# Patient Record
Sex: Male | Born: 1970 | Race: White | Hispanic: No | Marital: Single | Smoking: Never smoker
Health system: Southern US, Community
[De-identification: ages and names within clinical notes are randomized; demographics above are authoritative.]

## PROBLEM LIST (undated history)

## (undated) DIAGNOSIS — K2970 Gastritis, unspecified, without bleeding: Secondary | ICD-10-CM

## (undated) DIAGNOSIS — K449 Diaphragmatic hernia without obstruction or gangrene: Secondary | ICD-10-CM

## (undated) DIAGNOSIS — K2270 Barrett's esophagus without dysplasia: Secondary | ICD-10-CM

## (undated) DIAGNOSIS — B9681 Helicobacter pylori [H. pylori] as the cause of diseases classified elsewhere: Secondary | ICD-10-CM

## (undated) DIAGNOSIS — K219 Gastro-esophageal reflux disease without esophagitis: Secondary | ICD-10-CM

## (undated) DIAGNOSIS — T7840XA Allergy, unspecified, initial encounter: Secondary | ICD-10-CM

## (undated) DIAGNOSIS — E782 Mixed hyperlipidemia: Secondary | ICD-10-CM

## (undated) DIAGNOSIS — J329 Chronic sinusitis, unspecified: Secondary | ICD-10-CM

## (undated) HISTORY — DX: Gastritis, unspecified, without bleeding: K29.70

## (undated) HISTORY — DX: Chronic sinusitis, unspecified: J32.9

## (undated) HISTORY — PX: UPPER GI ENDOSCOPY: SHX6162

## (undated) HISTORY — DX: Gastro-esophageal reflux disease without esophagitis: K21.9

## (undated) HISTORY — DX: Allergy, unspecified, initial encounter: T78.40XA

## (undated) HISTORY — DX: Helicobacter pylori (H. pylori) as the cause of diseases classified elsewhere: B96.81

## (undated) HISTORY — DX: Mixed hyperlipidemia: E78.2

## (undated) HISTORY — PX: SINUS IRRIGATION: SHX2411

## (undated) HISTORY — DX: Barrett's esophagus without dysplasia: K22.70

## (undated) HISTORY — DX: Diaphragmatic hernia without obstruction or gangrene: K44.9

---

## 2015-04-09 DIAGNOSIS — J320 Chronic maxillary sinusitis: Secondary | ICD-10-CM | POA: Insufficient documentation

## 2016-07-17 ENCOUNTER — Encounter: Payer: Self-pay | Admitting: Physician Assistant

## 2016-07-17 ENCOUNTER — Ambulatory Visit (INDEPENDENT_AMBULATORY_CARE_PROVIDER_SITE_OTHER): Payer: Managed Care, Other (non HMO) | Admitting: Physician Assistant

## 2016-07-17 VITALS — BP 145/90 | HR 69 | Ht 70.0 in | Wt 183.0 lb

## 2016-07-17 DIAGNOSIS — K2100 Gastro-esophageal reflux disease with esophagitis, without bleeding: Secondary | ICD-10-CM

## 2016-07-17 DIAGNOSIS — M25511 Pain in right shoulder: Secondary | ICD-10-CM | POA: Insufficient documentation

## 2016-07-17 DIAGNOSIS — R030 Elevated blood-pressure reading, without diagnosis of hypertension: Secondary | ICD-10-CM

## 2016-07-17 DIAGNOSIS — Z7689 Persons encountering health services in other specified circumstances: Secondary | ICD-10-CM

## 2016-07-17 MED ORDER — MELOXICAM 15 MG PO TABS: ORAL_TABLET | ORAL | 1 refills | Status: DC

## 2016-07-17 NOTE — Patient Instructions (Addendum)
I have ordered fasting labs. The lab is a walk-in open M-F 8a-5p (closed 12:30-1:30p). Nothing to eat or drink after midnight or at least 8 hours before your blood draw. You can have water and your medications.   For your shoulder: - Meloxicam 1 tab daily with food for 2 weeks, then as needed for pain - Do not take any other OTC pain relievers - Tylenol is okay - Rehab exercises daily (see handout) - Follow-up with Sports Medicine in 2 weeks  For your blood pressure: - Check blood pressure at home for the next 2 weeks - Check around the same time each day in a relaxed setting (rest for at least 3 minutes, legs uncrossed, arm above level or your heart) - Limit salt. Follow DASH eating plan - Bring readings to your Sports Medicine appt. Or make a separate nurse visit for a BP check   DASH Eating Plan DASH stands for "Dietary Approaches to Stop Hypertension." The DASH eating plan is a healthy eating plan that has been shown to reduce high blood pressure (hypertension). It may also reduce your risk for type 2 diabetes, heart disease, and stroke. The DASH eating plan may also help with weight loss. What are tips for following this plan? General guidelines   Avoid eating more than 2,300 mg (milligrams) of salt (sodium) a day. If you have hypertension, you may need to reduce your sodium intake to 1,500 mg a day.  Limit alcohol intake to no more than 1 drink a day for nonpregnant women and 2 drinks a day for men. One drink equals 12 oz of beer, 5 oz of wine, or 1 oz of hard liquor.  Work with your health care provider to maintain a healthy body weight or to lose weight. Ask what an ideal weight is for you.  Get at least 30 minutes of exercise that causes your heart to beat faster (aerobic exercise) most days of the week. Activities may include walking, swimming, or biking.  Work with your health care provider or diet and nutrition specialist (dietitian) to adjust your eating plan to your  individual calorie needs. Reading food labels   Check food labels for the amount of sodium per serving. Choose foods with less than 5 percent of the Daily Value of sodium. Generally, foods with less than 300 mg of sodium per serving fit into this eating plan.  To find whole grains, look for the word "whole" as the first word in the ingredient list. Shopping   Buy products labeled as "low-sodium" or "no salt added."  Buy fresh foods. Avoid canned foods and premade or frozen meals. Cooking   Avoid adding salt when cooking. Use salt-free seasonings or herbs instead of table salt or sea salt. Check with your health care provider or pharmacist before using salt substitutes.  Do not fry foods. Cook foods using healthy methods such as baking, boiling, grilling, and broiling instead.  Cook with heart-healthy oils, such as olive, canola, soybean, or sunflower oil. Meal planning    Eat a balanced diet that includes:  5 or more servings of fruits and vegetables each day. At each meal, try to fill half of your plate with fruits and vegetables.  Up to 6-8 servings of whole grains each day.  Less than 6 oz of lean meat, poultry, or fish each day. A 3-oz serving of meat is about the same size as a deck of cards. One egg equals 1 oz.  2 servings of low-fat dairy each  day.  A serving of nuts, seeds, or beans 5 times each week.  Heart-healthy fats. Healthy fats called Omega-3 fatty acids are found in foods such as flaxseeds and coldwater fish, like sardines, salmon, and mackerel.  Limit how much you eat of the following:  Canned or prepackaged foods.  Food that is high in trans fat, such as fried foods.  Food that is high in saturated fat, such as fatty meat.  Sweets, desserts, sugary drinks, and other foods with added sugar.  Full-fat dairy products.  Do not salt foods before eating.  Try to eat at least 2 vegetarian meals each week.  Eat more home-cooked food and less restaurant,  buffet, and fast food.  When eating at a restaurant, ask that your food be prepared with less salt or no salt, if possible. What foods are recommended? The items listed may not be a complete list. Talk with your dietitian about what dietary choices are best for you. Grains  Whole-grain or whole-wheat bread. Whole-grain or whole-wheat pasta. Brown rice. [place]. Bulgur. Whole-grain and low-sodium cereals. Pita bread. Low-fat, low-sodium crackers. Whole-wheat flour tortillas. Vegetables  Fresh or frozen vegetables (raw, steamed, roasted, or grilled). Low-sodium or reduced-sodium tomato and vegetable juice. Low-sodium or reduced-sodium tomato sauce and tomato paste. Low-sodium or reduced-sodium canned vegetables. Fruits  All fresh, dried, or frozen fruit. Canned fruit in natural juice (without added sugar). Meat and other protein foods  Skinless chicken or [place]. Ground chicken or [place]. Pork with fat trimmed off. Fish and seafood. Egg whites. Dried beans, peas, or lentils. Unsalted nuts, nut butters, and seeds. Unsalted canned beans. Lean cuts of beef with fat trimmed off. Low-sodium, lean deli meat. Dairy  Low-fat (1%) or fat-free (skim) milk. Fat-free, low-fat, or reduced-fat cheeses. Nonfat, low-sodium ricotta or cottage cheese. Low-fat or nonfat yogurt. Low-fat, low-sodium cheese. Fats and oils  Soft margarine without trans fats. Vegetable oil. Low-fat, reduced-fat, or light mayonnaise and salad dressings (reduced-sodium). Canola, safflower, olive, soybean, and sunflower oils. Avocado. Seasoning and other foods  Herbs. Spices. Seasoning mixes without salt. Unsalted popcorn and pretzels. Fat-free sweets. What foods are not recommended? The items listed may not be a complete list. Talk with your dietitian about what dietary choices are best for you. Grains  Baked goods made with fat, such as croissants, muffins, or some breads. Dry pasta or rice meal packs. Vegetables  Creamed or  fried vegetables. Vegetables in a cheese sauce. Regular canned vegetables (not low-sodium or reduced-sodium). Regular canned tomato sauce and paste (not low-sodium or reduced-sodium). Regular tomato and vegetable juice (not low-sodium or reduced-sodium). Angie Fava. Olives. Fruits  Canned fruit in a light or heavy syrup. Fried fruit. Fruit in cream or butter sauce. Meat and other protein foods  Fatty cuts of meat. Ribs. Fried meat. Berniece Salines. Sausage. Bologna and other processed lunch meats. Salami. Fatback. Hotdogs. Bratwurst. Salted nuts and seeds. Canned beans with added salt. Canned or smoked fish. Whole eggs or egg yolks. Chicken or [place] with skin. Dairy  Whole or 2% milk, cream, and half-and-half. Whole or full-fat cream cheese. Whole-fat or sweetened yogurt. Full-fat cheese. Nondairy creamers. Whipped toppings. Processed cheese and cheese spreads. Fats and oils  Butter. Stick margarine. Lard. Shortening. Ghee. Bacon fat. Tropical oils, such as coconut, palm kernel, or palm oil. Seasoning and other foods  Salted popcorn and pretzels. Onion salt, garlic salt, seasoned salt, table salt, and sea salt. Worcestershire sauce. Tartar sauce. Barbecue sauce. Teriyaki sauce. Soy sauce, including reduced-sodium. Steak sauce. Canned and  packaged gravies. Fish sauce. Oyster sauce. Cocktail sauce. Horseradish that you find on the shelf. Ketchup. Mustard. Meat flavorings and tenderizers. Bouillon cubes. Hot sauce and Tabasco sauce. Premade or packaged marinades. Premade or packaged taco seasonings. Relishes. Regular salad dressings. Where to find more information:  National Heart, Lung, and [place]: https://wilson-eaton.com/  American Heart Association: www.heart.org Summary  The DASH eating plan is a healthy eating plan that has been shown to reduce high blood pressure (hypertension). It may also reduce your risk for type 2 diabetes, heart disease, and stroke.  With the DASH eating plan, you should limit salt  (sodium) intake to 2,300 mg a day. If you have hypertension, you may need to reduce your sodium intake to 1,500 mg a day.  When on the DASH eating plan, aim to eat more fresh fruits and vegetables, whole grains, lean proteins, low-fat dairy, and heart-healthy fats.  Work with your health care provider or diet and nutrition specialist (dietitian) to adjust your eating plan to your individual calorie needs. This information is not intended to replace advice given to you by your health care provider. Make sure you discuss any questions you have with your health care provider. Document Released: 02/27/2011 Document Revised: 03/03/2016 Document Reviewed: 03/03/2016 Elsevier Interactive Patient Education  2017 [employer].

## 2016-07-17 NOTE — Progress Notes (Signed)
HPI:                                                                [name] [name] is a 46 y.o. male who presents to [place]: [facility] today to establish care  Current Concerns include shoulder pain  Right shoulder pain: patient reports pain with overhead lifting that has been present for a few months. He does lift weights at the gym, but denies known injury or trauma. Denies any weakness or paresthesias.   GERD/Barrett's esophagus: was followed by Dr. Dolphus Jenny at [facility]. He also has a history of gastritis and H. Pylori. Taking Omeprazole 40mg  daily. Symptoms are controlled. He is overdue for a repeat EGD. HE is requesting to be tested for H. Pylori today.   Health Maintenance Health Maintenance  Topic Date Due  . HIV Screening  01/17/1988  . TETANUS/TDAP  01/17/1992  . INFLUENZA VACCINE  10/22/2016    Health Habits  Diet: good   Exercise: lifting x 60 mins x 3 days   Past Medical History:  Diagnosis Date  . Allergy   . Barrett's esophagus determined by biopsy   . Chronic sinusitis   . GERD (gastroesophageal reflux disease)   . Helicobacter pylori gastritis   . Hiatal hernia    Past Surgical History:  Procedure Laterality Date  . SINUS IRRIGATION    . UPPER GI ENDOSCOPY     Social History  Substance Use Topics  . Smoking status: Never Smoker  . Smokeless tobacco: Never Used  . Alcohol use 8.4 oz/week    14 Cans of beer per week   family history includes Hypertension in his father; Stomach cancer in his brother; Throat cancer in his father.  ROS: negative except as noted in the HPI  Medications: Current Outpatient Prescriptions  Medication Sig Dispense Refill  . omeprazole (PRILOSEC) 20 MG capsule Take by mouth.    . meloxicam (MOBIC) 15 MG tablet Take 1 tab by mouth daily with meals, then as needed for pain 30 tablet 1   No current facility-administered medications for this visit.    No Known Allergies     Objective:   BP (!) 145/90   Pulse 69   Ht 5\' 10"  (1.778 m)   Wt 183 lb (83 kg)   BMI 26.26 kg/m  Gen: well-groomed, cooperative, not ill-appearing, no distress HEENT: normal conjunctiva, wearing glasses, canals occluded by cerumen bilaterally, oropharynx clear, moist mucus membranes, no thyromegaly or tenderness Pulm: Normal work of breathing, normal phonation, clear to auscultation bilaterally CV: Normal rate, regular rhythm, s1 and s2 distinct, no murmurs, clicks or rubs, no carotid bruit GI: abdomen soft, nondistended, nontender, no masses Neuro: alert and oriented x 3, EOM's intact, PERRLA, normal tone, no tremor MSK: Right Shoulder: atraumatic, no a/c joint tenderness, positive Hawkins sign, positive Neers sign, strength 5/5; moving all extremities, normal gait and station, no peripheral edema Skin: warm and dry, no rashes or lesions on exposed skin Psych: normal affect, euthymic mood, normal speech and thought content  Depression screen [facility] 2/9 07/17/2016  Decreased Interest 0  Down, Depressed, Hopeless 0  PHQ - 2 Score 0    Assessment and Plan: 46 y.o. male with   1. Elevated blood pressure reading -  instructed on how to check BP's at home and provided with log - DASH eating plan - follow-up in 2 weeks  2. Encounter to establish care - CBC - Comprehensive metabolic panel - Lipid Panel w/reflex Direct LDL - declined STI testing  3. Acute pain of right shoulder - suspect rotator cuff pathology, impingement signs on exam, strength intact - history of gastritis, but no history of GI bleed. Patient is on Omeprazole daily and completed treatment for H. Pylori in 10/2015 - meloxicam (MOBIC) 15 MG tablet; Take 1 tab by mouth daily with meals, then as needed for pain  Dispense: 30 tablet; Refill: 1 - instructed to take with food and not to combine with any other NSAIDs - provided with rehab exercises - follow-up with Sports Medicine in 2 weeks   4. GERD with esophagitis, History of H.  Pylori  - reviewed Care Everywhere records, including EGD and biopsy results from 11/01/2015 - H. pylori breath test pending - cont Omeprazole 40mg  daily - recommended he follow-up with Dr. Dolphus Jenny for repeat EGD asap  Patient education and anticipatory guidance given Patient agrees with treatment plan Follow-up in 2 weeks or sooner as needed  Darlyne Russian PA-C

## 2016-07-18 LAB — H. PYLORI BREATH TEST: H. PYLORI BREATH TEST: NOT DETECTED

## 2017-08-27 ENCOUNTER — Ambulatory Visit: Payer: Managed Care, Other (non HMO) | Admitting: Physician Assistant

## 2017-09-28 ENCOUNTER — Ambulatory Visit: Payer: Self-pay | Admitting: Physician Assistant

## 2017-10-08 ENCOUNTER — Ambulatory Visit (INDEPENDENT_AMBULATORY_CARE_PROVIDER_SITE_OTHER): Payer: 59 | Admitting: Physician Assistant

## 2017-10-08 ENCOUNTER — Encounter: Payer: Self-pay | Admitting: Gastroenterology

## 2017-10-08 ENCOUNTER — Encounter: Payer: Self-pay | Admitting: Physician Assistant

## 2017-10-08 VITALS — BP 123/80 | HR 62 | Temp 97.9°F | Wt 185.0 lb

## 2017-10-08 DIAGNOSIS — Z13220 Encounter for screening for lipoid disorders: Secondary | ICD-10-CM

## 2017-10-08 DIAGNOSIS — Z8619 Personal history of other infectious and parasitic diseases: Secondary | ICD-10-CM | POA: Insufficient documentation

## 2017-10-08 DIAGNOSIS — Z131 Encounter for screening for diabetes mellitus: Secondary | ICD-10-CM

## 2017-10-08 DIAGNOSIS — K2270 Barrett's esophagus without dysplasia: Secondary | ICD-10-CM | POA: Diagnosis not present

## 2017-10-08 DIAGNOSIS — J0111 Acute recurrent frontal sinusitis: Secondary | ICD-10-CM

## 2017-10-08 DIAGNOSIS — H6123 Impacted cerumen, bilateral: Secondary | ICD-10-CM | POA: Diagnosis not present

## 2017-10-08 DIAGNOSIS — J310 Chronic rhinitis: Secondary | ICD-10-CM | POA: Diagnosis not present

## 2017-10-08 DIAGNOSIS — Z130 Encounter for screening for diseases of the blood and blood-forming organs and certain disorders involving the immune mechanism: Secondary | ICD-10-CM

## 2017-10-08 MED ORDER — FLUTICASONE PROPIONATE 50 MCG/ACT NA SUSP: 1.0000 | Freq: Every day | NASAL | 3 refills | Status: DC

## 2017-10-08 MED ORDER — FEXOFENADINE HCL 180 MG PO TABS: 180.0000 mg | ORAL_TABLET | Freq: Every day | ORAL | 3 refills | Status: DC

## 2017-10-08 MED ORDER — OMEPRAZOLE 20 MG PO CPDR
20.0000 mg | DELAYED_RELEASE_CAPSULE | Freq: Every day | ORAL | 0 refills | Status: DC
Start: 2017-10-08 — End: 2017-12-29

## 2017-10-08 MED ORDER — DOXYCYCLINE HYCLATE 100 MG PO TABS: 100.0000 mg | ORAL_TABLET | Freq: Two times a day (BID) | ORAL | 0 refills | Status: AC

## 2017-10-08 NOTE — Patient Instructions (Signed)
Sinus Headache A sinus headache occurs when the paranasal sinuses become clogged or swollen. Paranasal sinuses are air pockets within the bones of the face. Sinus headaches can range from mild to severe. What are the causes? A sinus headache can result from various conditions that affect the sinuses, such as:  Colds.  Sinus infections.  Allergies.  What are the signs or symptoms? The main symptom of this condition is a headache that may feel like pain or pressure in the face, forehead, ears, or upper teeth. People who have a sinus headache often have other symptoms, such as:  Congested or runny nose.  Fever.  Inability to smell.  Weather changes can make symptoms worse. How is this diagnosed? This condition may be diagnosed based on:  A physical exam and medical history.  Imaging tests, such as a CT scan and MRI, to check for problems with the sinuses.  A specialist may look into the sinuses with a tool that has a camera (endoscopy).  How is this treated? Treatment for this condition depends on the cause.  Sinus pain that is caused by a sinus infection may be treated with antibiotic medicine.  Sinus pain that is caused by allergies may be helped by allergy medicines (antihistamines) and medicated nasal sprays.  Sinus pain that is caused by congestion may be helped by flushing the nose and sinuses with saline solution.  Follow these instructions at home:  Take medicines only as directed by your health care provider.  If you were prescribed an antibiotic medicine, finish all of it even if you start to feel better.  If you have congestion, use a nasal spray to help reduce pressure.  If directed, apply a warm, moist washcloth to your face to help relieve pain. Contact a health care provider if:  You have headaches more than one time each week.  You have sensitivity to light or sound.  You have a fever.  You feel sick to your stomach (nauseous) or you throw up  (vomit).  Your headaches do not get better with treatment. Many people think that they have a sinus headache when they actually have migraines or tension headaches. Get help right away if:  You have vision problems.  You have sudden, severe pain in your face or head.  You have a seizure.  You are confused.  You have a stiff neck. This information is not intended to replace advice given to you by your health care provider. Make sure you discuss any questions you have with your health care provider. Document Released: 04/17/2004 Document Revised: 11/04/2015 Document Reviewed: 03/06/2014 Elsevier Interactive Patient Education  2018 Elsevier Inc.  

## 2017-10-08 NOTE — Progress Notes (Signed)
HPI:                                                                [name] [name] is a 47 y.o. male who presents to [place]: [facility] Sports Medicine today for headache and nasal congestion  Pleasant 47 yo M with PMH of chronic pansinusitis s/p sinus surgery presents with 10 days of frontal headaches, sinus pressure and jaw pain. Associated with tactile fevers (no measured temp), nasal congestion, purulent nasal drainage and nausea. Also reports intermittent sharp right ear pain. Has been self-treating with Amoxicillin (left-over Rx) x 2 days.  Also he has a history of Barrett's esophagus and H. Pylori. He is overdue for his surveillance EGD. Requesting referral to new GI specialist.   Depression screen [facility] 2/9 07/17/2016  Decreased Interest 0  Down, Depressed, Hopeless 0  PHQ - 2 Score 0    No flowsheet data found.    Past Medical History:  Diagnosis Date  . Allergy   . Barrett's esophagus determined by biopsy   . Chronic sinusitis   . GERD (gastroesophageal reflux disease)   . Helicobacter pylori gastritis   . Hiatal hernia    Past Surgical History:  Procedure Laterality Date  . SINUS IRRIGATION    . UPPER GI ENDOSCOPY     Social History   Tobacco Use  . Smoking status: Never Smoker  . Smokeless tobacco: Never Used  Substance Use Topics  . Alcohol use: Yes    Alcohol/week: 8.4 oz    Types: 14 Cans of beer per week   family history includes Hypertension in his father; Stomach cancer in his brother; Throat cancer in his father.    ROS: negative except as noted in the HPI  Medications: Current Outpatient Medications  Medication Sig Dispense Refill  . omeprazole (PRILOSEC) 20 MG capsule Take by mouth.    . doxycycline (VIBRA-TABS) 100 MG tablet Take 1 tablet (100 mg total) by mouth 2 (two) times daily for 10 days. 20 tablet 0  . fexofenadine (ALLEGRA) 180 MG tablet Take 1 tablet (180 mg total) by mouth at bedtime. 90 tablet 3  .  fluticasone (FLONASE) 50 MCG/ACT nasal spray Place 1 spray into both nostrils daily. 16 g 3   No current facility-administered medications for this visit.    No Known Allergies     Objective:  BP 123/80   Pulse 62   Temp 97.9 F (36.6 C) (Oral)   Wt 185 lb (83.9 kg)   BMI 26.54 kg/m  Gen:  alert, not ill-appearing, no distress, appropriate for age 64: head normocephalic without obvious abnormality, conjunctiva and cornea clear, TM's obstructed by cerumen bilaterally, manual debridement performed with lighted ear currette, unable to visual TM, there is right frontal sinus tenderness, nasal mucosa is edematous with purulent rhinorrhea, oropharynx clear, grade 2+ tonsils, neck supple, no cervical adenopathy, trachea midline Pulm: Normal work of breathing, normal phonation Neuro: alert and oriented x 3, no tremor MSK: extremities atraumatic, normal gait and station Skin: intact, no rashes on exposed skin, no jaundice, no cyanosis  No results found for this or any previous visit (from the past 72 hour(s)). No results found.    Assessment and Plan: 47 y.o. male with   Acute recurrent frontal  sinusitis - Plan: doxycycline (VIBRA-TABS) 100 MG tablet  History of Helicobacter pylori infection - Plan: Ambulatory referral to Gastroenterology  Barrett's esophagus without dysplasia - Plan: Ambulatory referral to Gastroenterology  Chronic rhinitis - Plan: fexofenadine (ALLEGRA) 180 MG tablet, fluticasone (FLONASE) 50 MCG/ACT nasal spray  Bilateral impacted cerumen  - counseled on nasal toileting, daily antihistamine and intranasal steroid - he has a history of chronic sinusitis and has  already been self-treating with Amoxicllin, so will treat empirically with Doxycycline in case of resistance - return for bilateral ear irrigation as needed. Counseled on self-cleaning nature of ears and to avoid Q-tips and fb in the ear  - referral placed to GI for surveillance of Barett's/GERD.  Continue Omeprazole  Patient education and anticipatory guidance given Patient agrees with treatment plan Follow-up as needed if symptoms worsen or fail to improve  Darlyne Russian PA-C

## 2017-10-12 ENCOUNTER — Ambulatory Visit: Payer: Self-pay | Admitting: Physician Assistant

## 2017-11-02 ENCOUNTER — Ambulatory Visit (INDEPENDENT_AMBULATORY_CARE_PROVIDER_SITE_OTHER): Payer: 59 | Admitting: Family Medicine

## 2017-11-02 ENCOUNTER — Encounter: Payer: Self-pay | Admitting: Family Medicine

## 2017-11-02 VITALS — BP 132/84 | HR 70 | Ht 70.0 in | Wt 183.0 lb

## 2017-11-02 DIAGNOSIS — R002 Palpitations: Secondary | ICD-10-CM

## 2017-11-02 DIAGNOSIS — J321 Chronic frontal sinusitis: Secondary | ICD-10-CM

## 2017-11-02 MED ORDER — AMOXICILLIN-POT CLAVULANATE 875-125 MG PO TABS: 1.0000 | ORAL_TABLET | Freq: Two times a day (BID) | ORAL | 0 refills | Status: DC

## 2017-11-02 NOTE — Patient Instructions (Signed)
If you cut back on her caffeine significantly and you are still noticing the heart pounding please let me know. If after 2 weeks on the antibiotic you are significantly better but just not completely better than please call back and we will call in an additional 7 days of antibiotics with Augmentin.

## 2017-11-02 NOTE — Progress Notes (Signed)
Subjective:    Patient ID: [name] [name], male    DOB: [birth year], 47 y.o.   MRN: [id number]  HPI 47 year old male comes in today for persistent sinus symptoms.  He was seen almost 4 weeks ago on July 18 having 10 days of frontal headaches, sinus pressure and jaw pain.  History of sinus surgery.  Was treated with 10 days of doxycycline. He still has facial pain and pressure.  He says the doxycycline did help with the nausea that he was experiencing but did not really help with his sinus pressure pain.  Says he feels like his heart is Flopping or skipping a beat x 1.5 weeks.  His heart which is been a long normal and all of a sudden it will pound just first thing will be and then go back into rhythm.  He does not expense any chest pain with it.  He is taking her reflux med consistantly.  No pain, numbness or tingling in his arm.  Occ gets tingling in his face sometime. Drinks 2-3 energy drinks per day.  Drink has 300 mg of caffeine.   Review of Systems  BP 140/73   Pulse 70   Ht 5\' 10"  (1.778 m)   Wt 183 lb (83 kg)   SpO2 98%   BMI 26.26 kg/m     No Known Allergies  Past Medical History:  Diagnosis Date  . Allergy   . Barrett's esophagus determined by biopsy   . Chronic sinusitis   . GERD (gastroesophageal reflux disease)   . Helicobacter pylori gastritis   . Hiatal hernia     Past Surgical History:  Procedure Laterality Date  . SINUS IRRIGATION    . UPPER GI ENDOSCOPY      Social History   Socioeconomic History  . Marital status: Married    Spouse name: Not on file  . Number of children: Not on file  . Years of education: Not on file  . Highest education level: Not on file  Occupational History  . Not on file  Social Needs  . Financial resource strain: Not on file  . Food insecurity:    Worry: Not on file    Inability: Not on file  . Transportation needs:    Medical: Not on file    Non-medical: Not on file  Tobacco Use  . Smoking status: Never Smoker  .  Smokeless tobacco: Never Used  Substance and Sexual Activity  . Alcohol use: Yes    Alcohol/week: 14.0 standard drinks    Types: 14 Cans of beer per week  . Drug use: No  . Sexual activity: Yes    Birth control/protection: None  Lifestyle  . Physical activity:    Days per week: Not on file    Minutes per session: Not on file  . Stress: Not on file  Relationships  . Social connections:    Talks on phone: Not on file    Gets together: Not on file    Attends religious service: Not on file    Active member of club or organization: Not on file    Attends meetings of clubs or organizations: Not on file    Relationship status: Not on file  . Intimate partner violence:    Fear of current or ex partner: Not on file    Emotionally abused: Not on file    Physically abused: Not on file    Forced sexual activity: Not on file  Other Topics Concern  . Not  on file  Social History Narrative  . Not on file    Family History  Problem Relation Age of Onset  . Hypertension Father   . Throat cancer Father   . Stomach cancer Brother     Outpatient Encounter Medications as of 11/02/2017  Medication Sig  . amoxicillin-clavulanate (AUGMENTIN) 875-125 MG tablet Take 1 tablet by mouth 2 (two) times daily.  . fexofenadine (ALLEGRA) 180 MG tablet Take 1 tablet (180 mg total) by mouth at bedtime.  . fluticasone (FLONASE) 50 MCG/ACT nasal spray Place 1 spray into both nostrils daily.  Marland Kitchen omeprazole (PRILOSEC) 20 MG capsule Take 1 capsule (20 mg total) by mouth daily.   No facility-administered encounter medications on file as of 11/02/2017.          Objective:   Physical Exam  Constitutional: He is oriented to person, place, and time. He appears well-developed and well-nourished.  HENT:  Head: Normocephalic and atraumatic.  Right Ear: External ear normal.  Left Ear: External ear normal.  Nose: Nose normal.  Mouth/Throat: Oropharynx is clear and moist.  TMs and canals are clear.   Eyes:  Pupils are equal, round, and reactive to light. Conjunctivae and EOM are normal.  Neck: Neck supple. No thyromegaly present.  Cardiovascular: Normal rate and normal heart sounds.  Pulmonary/Chest: Effort normal and breath sounds normal.  Lymphadenopathy:    He has no cervical adenopathy.  Neurological: He is alert and oriented to person, place, and time.  Skin: Skin is warm and dry.  + tattoos  Psychiatric: He has a normal mood and affect.          Assessment & Plan:  Sinusitis-we will start with 2 weeks of Augmentin.  At that point if he is feeling better but does not completely better than please call back and we will add an additional 7 days.  Palpitations-did do an EKG today.  It sounds like it is probably related to excess intake of caffeine.  Encouraged him to cut back significantly.  And if symptoms persist then please let me know.  EKG shows rate of 62 bpm, normal sinus rhythm with no acute ST-T wave changes.  Declined Tdap today.

## 2017-11-06 ENCOUNTER — Encounter: Payer: Self-pay | Admitting: Gastroenterology

## 2017-11-06 ENCOUNTER — Ambulatory Visit (INDEPENDENT_AMBULATORY_CARE_PROVIDER_SITE_OTHER): Payer: 59 | Admitting: Gastroenterology

## 2017-11-06 VITALS — BP 104/76 | HR 61 | Ht 69.0 in | Wt 185.4 lb

## 2017-11-06 DIAGNOSIS — K449 Diaphragmatic hernia without obstruction or gangrene: Secondary | ICD-10-CM | POA: Diagnosis not present

## 2017-11-06 DIAGNOSIS — K219 Gastro-esophageal reflux disease without esophagitis: Secondary | ICD-10-CM

## 2017-11-06 DIAGNOSIS — K2270 Barrett's esophagus without dysplasia: Secondary | ICD-10-CM

## 2017-11-06 DIAGNOSIS — R1013 Epigastric pain: Secondary | ICD-10-CM

## 2017-11-06 DIAGNOSIS — G8929 Other chronic pain: Secondary | ICD-10-CM

## 2017-11-06 NOTE — Patient Instructions (Signed)
If you are age 45 or older, your body mass index should be between 23-30. Your Body mass index is 27.38 kg/m. If this is out of the aforementioned range listed, please consider follow up with your Primary Care Provider.  If you are age 67 or younger, your body mass index should be between 19-25. Your Body mass index is 27.38 kg/m. If this is out of the aformentioned range listed, please consider follow up with your Primary Care Provider.   You have been scheduled for an endoscopy. Please follow written instructions given to you at your visit today. If you use inhalers (even only as needed), please bring them with you on the day of your procedure. Your physician has requested that you go to www.startemmi.com and enter the access code given to you at your visit today. This web site gives a general overview about your procedure. However, you should still follow specific instructions given to you by our office regarding your preparation for the procedure.  It was a pleasure to see you today!  Vito Cirigliano, D.O.

## 2017-11-06 NOTE — Progress Notes (Signed)
Chief Complaint: GERD, abdominal pain   Referring Provider:     Trixie Dredge, PA-C    HPI:     [name] [name] is a 47 y.o. male referred to the [facility] for evaluation of abdominal pain and nausea, along with ongoing care for GERD and Barretts Esophagus. He was previously followed by GI in [place] in July 2017, with EGD at that time n/f H pylori gastritis and Barrett's Esophagus. Only path report available for review, so unclear of extent of BE. Additionally, he reports having [place] on that study, but again, unclear of size of HH. Was recommended to have repeat study, but did not f/u.   He has a long hx of GERD with index sxs of HB, regurgitation, and dyspepsia. Sxs overall controlled with omeprazole 20 mg daily, pre-breakfast. Occasaional breakthrough sxs. Exacrbated by spicy, red sauces, and bananas. No associated dysphagia, odynophagia.   Additionally, he endorses epigastric pain and bloating. Sxs similar to 2017 when seeing GI and improved after tx for H pylori, with recurrence in the last 3-4 months. No associated n/v, weight loss, melena, hematochezia, fever, chills, night sweats.    Past Medical History:  Diagnosis Date  . Allergy   . Barrett's esophagus determined by biopsy   . Chronic sinusitis   . GERD (gastroesophageal reflux disease)   . Helicobacter pylori gastritis   . Hiatal hernia      Past Surgical History:  Procedure Laterality Date  . SINUS IRRIGATION    . UPPER GI ENDOSCOPY     Family History  Problem Relation Age of Onset  . Hypertension Father   . Throat cancer Father   . Stomach cancer Brother   . Colon cancer Paternal Aunt   . Rectal cancer Neg Hx    Social History   Tobacco Use  . Smoking status: Never Smoker  . Smokeless tobacco: Never Used  Substance Use Topics  . Alcohol use: Yes    Alcohol/week: 14.0 standard drinks    Types: 14 Cans of beer per week  . Drug use: No   Current Outpatient  Medications  Medication Sig Dispense Refill  . fexofenadine (ALLEGRA) 180 MG tablet Take 1 tablet (180 mg total) by mouth at bedtime. 90 tablet 3  . fluticasone (FLONASE) 50 MCG/ACT nasal spray Place 1 spray into both nostrils daily. 16 g 3  . omeprazole (PRILOSEC) 20 MG capsule Take 1 capsule (20 mg total) by mouth daily. 90 capsule 0  . amoxicillin-clavulanate (AUGMENTIN) 875-125 MG tablet Take 1 tablet by mouth 2 (two) times daily. (Patient not taking: Reported on 11/06/2017) 20 tablet 0   No current facility-administered medications for this visit.    No Known Allergies   Review of Systems: All systems reviewed and negative except where noted in HPI.     Physical Exam:    Wt Readings from Last 3 Encounters:  11/06/17 185 lb 6 oz (84.1 kg)  11/02/17 183 lb (83 kg)  10/08/17 185 lb (83.9 kg)    BP 104/76   Pulse 61   Ht 5\' 9"  (1.753 m)   Wt 185 lb 6 oz (84.1 kg)   BMI 27.38 kg/m  Constitutional:  Pleasant, in no acute distress. Psychiatric: Normal mood and affect. Behavior is normal. EENT: Pupils normal.  Conjunctivae are normal. No scleral icterus. Neck supple. No cervical LAD. Cardiovascular: Normal rate, regular rhythm. No edema Pulmonary/chest: Effort normal and breath sounds normal. No  wheezing, rales or rhonchi. Abdominal: Soft, nondistended, nontender. Bowel sounds active throughout. There are no masses palpable. No hepatomegaly. Neurological: Alert and oriented to person place and time. Skin: Skin is warm and dry. No rashes noted.   ASSESSMENT AND PLAN;   1) GERD: Long standing hx of reflux symptoms (heartburn and regurgitation) which is reported to be relatively well controlled on low dose PPI. However, c/b Barrett's Esophagus of unknown length in 2017. Given active sxs as below, along with BE, will plan on EGD at this time and can also eval for erosive esophagitis suggestive of silent reflux/burnout at that time  - Resume Prilosec 20 mg daily for now -  Educated on lifestyle modifications for reflux control, to include: EtOH and tobacco avoidance, avoid known triggers, carbonated beverages, chocolate, and caffeine. Patient should consume smaller more frequent meals; maintain a healthy BMI, exercise 3-4 times/week, avoid eating 3 hours prior to bedtime and to maintain the [facility] at a 30-45 degree elevation.   2) Barrett's Esophagus: Hx of BE diagnosed in 2017. Recommend repeat at this time for confirmation of dx, establish extent/severity of disease which would affect potential tx options (ie, medical management alone vs need for ablation) - Resume PPI as above - Educated on Barretts Esophagus with risk of progression to [facility] and need for regular f/u and surveillance  3) Abdominal pain: - EGD with random and directed bxs to eval for mucosal/luminal etiology, particularly with simalarity to prior H pylori gastritis which resolved with antimicrobial therapy - If no etiology on EGD/bxs, can trial course of probiotics vs breath testing  4) Hx of hiatal hernia - Will assess presence and size on EGD as above   The indications, risks, and benefits of EGD were explained to the patient in detail. Risks include but are not limited to bleeding, perforation, adverse reaction to medications, and cardiopulmonary compromise. Sequelae include but are not limited to the possibility of surgery, hositalization, and mortality. The patient verbalized understanding and wished to proceed. All questions answered, referred to scheduler. Further recommendations pending results of the exam.    Lavena Bullion, DO, FACG  11/06/2017, 2:59 PM  Cc: Trixie Dredge, PA-C

## 2017-11-10 ENCOUNTER — Ambulatory Visit (AMBULATORY_SURGERY_CENTER): Payer: 59 | Admitting: Gastroenterology

## 2017-11-10 ENCOUNTER — Encounter: Payer: Self-pay | Admitting: Gastroenterology

## 2017-11-10 VITALS — BP 146/80 | HR 54 | Temp 99.3°F | Resp 13 | Ht 69.0 in | Wt 185.0 lb

## 2017-11-10 DIAGNOSIS — R1013 Epigastric pain: Secondary | ICD-10-CM

## 2017-11-10 DIAGNOSIS — K2970 Gastritis, unspecified, without bleeding: Secondary | ICD-10-CM

## 2017-11-10 DIAGNOSIS — R140 Abdominal distension (gaseous): Secondary | ICD-10-CM

## 2017-11-10 DIAGNOSIS — K2990 Gastroduodenitis, unspecified, without bleeding: Secondary | ICD-10-CM

## 2017-11-10 DIAGNOSIS — K219 Gastro-esophageal reflux disease without esophagitis: Secondary | ICD-10-CM | POA: Diagnosis not present

## 2017-11-10 DIAGNOSIS — K2270 Barrett's esophagus without dysplasia: Secondary | ICD-10-CM

## 2017-11-10 MED ORDER — SODIUM CHLORIDE 0.9 % IV SOLN: 500.0000 mL | Freq: Once | INTRAVENOUS | Status: DC

## 2017-11-10 NOTE — Progress Notes (Signed)
Report given to PACU, vss 

## 2017-11-10 NOTE — Patient Instructions (Signed)
YOU HAD AN ENDOSCOPIC PROCEDURE TODAY AT [facility]:   Refer to the procedure report that was given to you for any specific questions about what was found during the examination.  If the procedure report does not answer your questions, please call your gastroenterologist to clarify.  If you requested that your care partner not be given the details of your procedure findings, then the procedure report has been included in a sealed envelope for you to review at your convenience later.  YOU SHOULD EXPECT: Some feelings of bloating in the abdomen. Passage of more gas than usual.  Walking can help get rid of the air that was put into your GI tract during the procedure and reduce the bloating. If you had a lower endoscopy (such as a colonoscopy or flexible sigmoidoscopy) you may notice spotting of blood in your stool or on the toilet paper. If you underwent a bowel prep for your procedure, you may not have a normal bowel movement for a few days.  Please Note:  You might notice some irritation and congestion in your nose or some drainage.  This is from the oxygen used during your procedure.  There is no need for concern and it should clear up in a day or so.  SYMPTOMS TO REPORT IMMEDIATELY:    Following upper endoscopy (EGD)  Vomiting of blood or coffee ground material  New chest pain or pain under the shoulder blades  Painful or persistently difficult swallowing  New shortness of breath  Fever of 100F or higher  Black, tarry-looking stools  For urgent or emergent issues, a gastroenterologist can be reached at any hour by calling [phone].   DIET:  We do recommend a small meal at first, but then you may proceed to your regular diet.  Drink plenty of fluids but you should avoid alcoholic beverages for 24 hours.  ACTIVITY:  You should plan to take it easy for the rest of today and you should NOT DRIVE or use heavy machinery until tomorrow (because of the sedation medicines used  during the test).    FOLLOW UP: Our staff will call the number listed on your records the next business day following your procedure to check on you and address any questions or concerns that you may have regarding the information given to you following your procedure. If we do not reach you, we will leave a message.  However, if you are feeling well and you are not experiencing any problems, there is no need to return our call.  We will assume that you have returned to your regular daily activities without incident.  If any biopsies were taken you will be contacted by phone or by letter within the next 1-3 weeks.  Please call us at [phone] if you have not heard about the biopsies in 3 weeks.    SIGNATURES/CONFIDENTIALITY: You and/or your care partner have signed paperwork which will be entered into your electronic medical record.  These signatures attest to the fact that that the information above on your After Visit Summary has been reviewed and is understood.  Full responsibility of the confidentiality of this discharge information lies with you and/or your care-partner.   Handouts were given to your care partner on Barrett's Esophagus, GERD, and Gastritis. You may resume your current medications today. Await biopsy results. Please call if any questions or concerns.

## 2017-11-10 NOTE — Op Note (Signed)
[place] Patient Name: [name] [name] Procedure Date: 11/10/2017 9:11 AM MRN: [id number] Endoscopist: Gerrit Heck , MD Age: 47 Referring MD:  Date of Birth: 07-07-1970 Gender: Male Account #: [id number] Procedure:                Upper GI endoscopy Indications:              Epigastric abdominal pain, Esophageal reflux,                            Follow-up of Barrett's esophagus                           47 yo male with a long-standing history of GERD                            with a previous diagnosis of Barrett's Esophagus                            from an outside facility (only previous path report                            viewable and not endoscopy report to establish                            length of Barrett's segment), as well as a history                            of H pylori gastritis in 2017, s/p eradication with                            antimicrobial therapy. He presents today for                            ongoing evaluation of reflux as well as evaluation                            of midepigastric pain and abdominal bloating.                            Reflux currently treated with omeprazole 20 mg PO                            daily with occasional breakthough symptoms. Medicines:                Monitored Anesthesia Care Procedure:                Pre-Anesthesia Assessment:                           - Prior to the procedure, a History and Physical                            was performed, and patient medications and  allergies were reviewed. The patient's tolerance of                            previous anesthesia was also reviewed. The risks                            and benefits of the procedure and the sedation                            options and risks were discussed with the patient.                            All questions were answered, and informed consent                            was obtained. Prior  Anticoagulants: The patient has                            taken no previous anticoagulant or antiplatelet                            agents. ASA Grade Assessment: II - A patient with                            mild systemic disease. After reviewing the risks                            and benefits, the patient was deemed in                            satisfactory condition to undergo the procedure.                           After obtaining informed consent, the endoscope was                            passed under direct vision. Throughout the                            procedure, the patient's blood pressure, pulse, and                            oxygen saturations were monitored continuously. The                            Model GIF-HQ190 [id number]) scope was introduced                            through the mouth, and advanced to the second part                            of duodenum. The upper GI endoscopy was  accomplished without difficulty. The patient                            tolerated the procedure well. Scope In: Scope Out: Findings:                 Esophagogastric landmarks were identified: the                            Z-line was found at 40 cm, the gastroesophageal                            junction was found at 41 cm and the site of hiatal                            narrowing was found at 41 cm from the incisors.                           Circumferential salmon-colored mucosa was present                            from 40 to 41 cm. No other visible abnormalities                            were present. The maximum longitudinal extent of                            these esophageal mucosal changes was 1 cm in                            length. Biopsies were taken with a cold forceps for                            histology. Estimated blood loss was minimal.                           The upper third of the esophagus and middle third                             of the esophagus were normal.                           The gastroesophageal flap valve was visualized                            endoscopically and classified as Hill Grade II                            (fold present, opens with respiration).                           Scattered mild inflammation characterized by  erythema was found in the gastric body and in the                            gastric antrum. Biopsies were taken with a cold                            forceps for Helicobacter pylori testing. Estimated                            blood loss was minimal.                           The duodenal bulb, first portion of the duodenum                            and second portion of the duodenum were normal.                            Biopsies for histology were taken with a cold                            forceps for evaluation of celiac disease. Complications:            No immediate complications. Estimated Blood Loss:     Estimated blood loss was minimal. Impression:               - Esophagogastric landmarks identified.                           - Salmon-colored mucosa suspicious for short                            segment Barrett's Esophagus, characterized as stage                            C1-M1 per [place] criteria. Biopsied for                            confirmation of Intestinal Metaplasia.                           - Normal upper third of esophagus and middle third                            of esophagus.                           - Gastroesophageal flap valve classified as Hill                            Grade II (fold present, opens with respiration).                           - Gastritis. Biopsied.                           -  Normal duodenal bulb, first portion of the                            duodenum and second portion of the duodenum.                            Biopsied. Recommendation:           - Patient has a contact number  available for                            emergencies. The signs and symptoms of potential                            delayed complications were discussed with the                            patient. Return to normal activities tomorrow.                            Written discharge instructions were provided to the                            patient.                           - Resume previous diet today.                           - Continue present medications.                           - Await pathology results. Gerrit Heck, MD 11/10/2017 9:39:44 AM

## 2017-11-10 NOTE — Progress Notes (Signed)
No problems noted in the recovery room. maw 

## 2017-11-10 NOTE — Progress Notes (Signed)
Called to room to assist during endoscopic procedure.  Patient ID and intended procedure confirmed with present staff. Received instructions for my participation in the procedure from the performing physician.  

## 2017-11-10 NOTE — Progress Notes (Signed)
Pt's states no medical or surgical changes since previsit or office visit. 

## 2017-11-11 ENCOUNTER — Telehealth: Payer: Self-pay

## 2017-11-11 NOTE — Telephone Encounter (Signed)
  Follow up Call-  Call back number 11/10/2017  Post procedure Call Back phone  # [phone]  Permission to leave phone message Yes     Patient questions:  Do you have a fever, pain , or abdominal swelling? No. Pain Score  0 *  Have you tolerated food without any problems? Yes.    Have you been able to return to your normal activities? Yes.    Do you have any questions about your discharge instructions: Diet   No. Medications  No. Follow up visit  No.  Do you have questions or concerns about your Care? No.  Actions: * If pain score is 4 or above: No action needed, pain <4.

## 2017-11-11 NOTE — Telephone Encounter (Signed)
Called [phone] and left a messaged we tried to reach pt for a follow up call. maw

## 2017-11-13 ENCOUNTER — Encounter: Payer: Self-pay | Admitting: Gastroenterology

## 2017-11-17 ENCOUNTER — Encounter: Payer: Self-pay | Admitting: Gastroenterology

## 2017-12-29 ENCOUNTER — Other Ambulatory Visit: Payer: Self-pay | Admitting: Physician Assistant

## 2018-03-10 ENCOUNTER — Ambulatory Visit (INDEPENDENT_AMBULATORY_CARE_PROVIDER_SITE_OTHER): Payer: 59 | Admitting: Physician Assistant

## 2018-03-10 ENCOUNTER — Encounter: Payer: Self-pay | Admitting: Physician Assistant

## 2018-03-10 ENCOUNTER — Ambulatory Visit: Payer: 59 | Admitting: Physician Assistant

## 2018-03-10 VITALS — BP 136/88 | HR 72 | Temp 98.0°F | Wt 187.0 lb

## 2018-03-10 DIAGNOSIS — R360 Urethral discharge without blood: Secondary | ICD-10-CM | POA: Insufficient documentation

## 2018-03-10 DIAGNOSIS — Z125 Encounter for screening for malignant neoplasm of prostate: Secondary | ICD-10-CM

## 2018-03-10 DIAGNOSIS — I10 Essential (primary) hypertension: Secondary | ICD-10-CM

## 2018-03-10 DIAGNOSIS — R369 Urethral discharge, unspecified: Secondary | ICD-10-CM | POA: Diagnosis not present

## 2018-03-10 DIAGNOSIS — R809 Proteinuria, unspecified: Secondary | ICD-10-CM | POA: Insufficient documentation

## 2018-03-10 DIAGNOSIS — Z130 Encounter for screening for diseases of the blood and blood-forming organs and certain disorders involving the immune mechanism: Secondary | ICD-10-CM

## 2018-03-10 DIAGNOSIS — J069 Acute upper respiratory infection, unspecified: Secondary | ICD-10-CM

## 2018-03-10 DIAGNOSIS — Z13220 Encounter for screening for lipoid disorders: Secondary | ICD-10-CM

## 2018-03-10 LAB — POCT URINALYSIS DIPSTICK
Bilirubin, UA: NEGATIVE
Blood, UA: NEGATIVE
Glucose, UA: NEGATIVE
Ketones, UA: NEGATIVE
Leukocytes, UA: NEGATIVE
Nitrite, UA: NEGATIVE
PH UA: 5.5 (ref 5.0–8.0)
Protein, UA: POSITIVE — AB
Spec Grav, UA: >=1.03 — AB (ref 1.010–1.025)
Urobilinogen, UA: 0.2 E.U./dL

## 2018-03-10 MED ORDER — BENZONATATE 200 MG PO CAPS: 200.0000 mg | ORAL_CAPSULE | Freq: Three times a day (TID) | ORAL | 0 refills | Status: DC | PRN

## 2018-03-10 MED ORDER — IPRATROPIUM BROMIDE 0.06 % NA SOLN: 2.0000 | Freq: Four times a day (QID) | NASAL | 0 refills | Status: DC | PRN

## 2018-03-10 NOTE — Patient Instructions (Addendum)
For your blood pressure: - Goal <130/80 (Ideally 120's/70's) - monitor and log blood pressures at home - check around the same time each day in a relaxed setting - Limit salt to <2500 mg/day - Follow DASH (Dietary Approach to Stopping Hypertension) eating plan - Try to get at least 150 minutes of aerobic exercise per week - Aim to go on a brisk walk 30 minutes per day at least 5 days per week. If you're not active, gradually increase how long you walk by 5 minutes each week - limit alcohol: 2 standard drinks per day for men and 1 per day for women - avoid tobacco/nicotine products. Consider smoking cessation if you smoke - weight loss: 7% of current body weight can reduce your blood pressure by 5-10 points - follow-up at least every 6 months for your blood pressure. Follow-up sooner if your BP is not controlled   Proteinuria  Proteinuria is when there is too much protein in the urine. Proteins are important for buildingmuscles and bones. Proteins are also needed to fight infections, help the blood clot, and keep body fluids in balance. Proteinuria may be mild and temporary, or it may be an early sign of kidney disease. The kidneys make urine. Healthy kidneys also keep substances like proteins from leaving the blood and ending up in the urine. Proteinuria may be a sign that the kidneys are not working well. What are the causes? Healthy kidneys have filters (glomeruli) that keep proteins out of the urine. Proteinuria may mean that the glomeruli are damaged. The main causes of this type of damage are:  Diabetes.  High blood pressure. Other causes of kidney damage can also cause proteinuria, such as:  Diseases of the immune system, such as lupus, rheumatoid arthritis, sarcoidosis, and Goodpasture syndrome.  Heart disease or heart failure.  Kidney infection.  Certain cancers, including kidney cancer, lymphoma, leukemia, and multiple myeloma.  Amyloidosis. This is a disease that causes  abnormal proteins to build up in body tissues.  Reactions to certain medicines, such as NSAIDs.  Injury (trauma) or poisons (toxins).  High blood pressure that occurs during pregnancy (preeclampsia). Temporary proteinuria may result from conditions that put stress on the kidneys. These conditions usually do not cause kidney damage. They include:  Fever.  Exposure to cold or heat.  Emotional or physical stress.  Extreme exercise.  Standing for long periods of time. What increases the risk? This condition is more likely to develop in people who:  Have diabetes.  Have high blood pressure.  Have heart disease or heart failure.  Have an immune disease, cancer, or other disease that affects the kidneys.  Have a family history of kidney disease.  Are 65 or older.  Are overweight.  Are of African-American, American [place], Hispanic/Latino, or [place] descent.  Are pregnant.  Have an infection. What are the signs or symptoms? Mild proteinuria may not cause symptoms. As more proteins enter the urine, symptoms of kidney disease may develop, such as:  Foamy urine.  Swelling of the face, abdomen, hands, legs, or feet (edema).  Needing to urinate frequently.  Fatigue.  Difficulty sleeping.  Dry and itchy skin.  Nausea and vomiting.  Muscle cramps.  Shortness of breath. How is this diagnosed? Your health care provider can diagnose proteinuria with a urine test. You may have this test as part of a routine physical or because you have symptoms of kidney disease or risk factors for kidney disease. You may also have:  Blood tests to  measure the level of a certain substance (creatinine) that increases with kidney disease.  Imaging tests of your kidney, such as a CT scan or an ultrasound, to look for signs of kidney damage. How is this treated? If your proteinuria is mild or temporary, no treatment may be needed. Your health care provider may show you how to  monitor the level of protein in your urine at home. Identifying proteinuria early is important so that the cause of the condition can be treated. Treatment depends on the cause of your proteinuria. Treatment may include:  Making diet and lifestyle changes.  Getting blood pressure under control.  Getting blood sugar under control, if you have diabetes.  Managing any other medical conditions you have that affect your kidneys.  Giving birth, if you are pregnant.  Avoiding medicines that damage your kidneys.  Treating kidney disease with medicine and dialysis, as needed. Follow these instructions at home:  Check your protein levels at home, if directed by your health care provider.  Follow instructions from your health care provider about eating or drinking restrictions.  Take over-the-counter and prescription medicines only as told by your health care provider.  Return to your normal activities as told by your health care provider. Ask your health care provider what activities are safe for you.  If you are overweight, ask your health care provider to help you with a diet to get to a healthy weight.  Ask your health care provider to help you with an exercise program.  Keep all follow-up visits as told by your health care provider. This is important. Contact a health care provider if:  You have new symptoms.  Your symptoms get worse or do not improve. Get help right away if:  You have back pain.  You have diarrhea.  You vomit.  You have a fever.  You have a rash. This information is not intended to replace advice given to you by your health care provider. Make sure you discuss any questions you have with your health care provider. Document Released: 04/30/2005 Document Revised: 04/20/2015 Document Reviewed: 01/29/2015 Elsevier Interactive Patient Education  2019 [employer].

## 2018-03-10 NOTE — Progress Notes (Signed)
HPI:                                                                [name] [name] is a 47 y.o. male who presents to [place]: [facility] today for cough and penile discharge  Cough  This is a new problem. The current episode started yesterday. The problem has been unchanged. The cough is productive of sputum. Associated symptoms include nasal congestion, postnasal drip, rhinorrhea (yellow) and wheezing. Pertinent negatives include no chest pain or shortness of breath. Nothing aggravates the symptoms. He has tried nothing for the symptoms. There is no history of pneumonia.  Son was also sick with respiratory illness last week  Penile discharge White purulent looking drainage from penis. no blood.Urine occasionally looks rusty and malodorous. Occasional pain after ejaculation and has to strain to urinate after intercourse.  Onset 10 years ago Unchanged Symptoms are intermittent No prior hx of STI, but has not been recently tested Sexually active with 1 male partner Denies urgency, frequency, hesitancy, weak stream, incomplete bladder emptying, postvoid dribbling.   Past Medical History:  Diagnosis Date  . Allergy   . Barrett's esophagus determined by biopsy   . Chronic sinusitis   . GERD (gastroesophageal reflux disease)   . Helicobacter pylori gastritis   . Hiatal hernia    Past Surgical History:  Procedure Laterality Date  . SINUS IRRIGATION    . UPPER GI ENDOSCOPY     Social History   Tobacco Use  . Smoking status: Never Smoker  . Smokeless tobacco: Never Used  Substance Use Topics  . Alcohol use: Yes    Alcohol/week: 14.0 standard drinks    Types: 14 Cans of beer per week   family history includes Colon cancer in his paternal aunt; Hypertension in his father; Stomach cancer in his brother; Throat cancer in his father.    ROS: negative except as noted in the HPI  Medications: Current Outpatient Medications  Medication Sig  Dispense Refill  . fexofenadine (ALLEGRA) 180 MG tablet Take 1 tablet (180 mg total) by mouth at bedtime. 90 tablet 3  . omeprazole (PRILOSEC) 20 MG capsule TAKE 1 CAPSULE BY MOUTH EVERY DAY 90 capsule 0  . benzonatate (TESSALON) 200 MG capsule Take 1 capsule (200 mg total) by mouth 3 (three) times daily as needed for cough. 30 capsule 0  . ipratropium (ATROVENT) 0.06 % nasal spray Place 2 sprays into both nostrils 4 (four) times daily as needed for rhinitis. 15 mL 0   No current facility-administered medications for this visit.    No Known Allergies     Objective:  BP 136/88   Pulse 72   Temp 98 F (36.7 C) (Oral)   Wt 187 lb (84.8 kg)   SpO2 97%   BMI 27.62 kg/m  Gen:  alert, not ill-appearing, no distress, appropriate for age HEENT: head normocephalic without obvious abnormality, conjunctiva and cornea clear, TM's pearly gray and semi-transparent, oropharynx with mild redness, no exudates, uvula midline, neck supple, no cervical adenopathy, trachea midline Pulm: Normal work of breathing, normal phonation, clear to auscultation bilaterally, no wheezes, rales or rhonchi CV: Normal rate, regular rhythm, s1 and s2 distinct, no murmurs, clicks or rubs  Neuro: alert and oriented x 3,  no tremor MSK: extremities atraumatic, normal gait and station Skin: intact, no rashes on exposed skin, no jaundice, no cyanosis  No results found for: CREATININE, BUN, NA, K, CL, CO2   Results for orders placed or performed in visit on 03/10/18 (from the past 72 hour(s))  POCT Urinalysis Dipstick     Status: Abnormal   Collection Time: 03/10/18 11:59 AM  Result Value Ref Range   Color, UA dark yellow    Clarity, UA clear    Glucose, UA Negative Negative   Bilirubin, UA negative    Ketones, UA negative    Spec Grav, UA >=1.030 (A) 1.010 - 1.025   Blood, UA negative    pH, UA 5.5 5.0 - 8.0   Protein, UA Positive (A) Negative   Urobilinogen, UA 0.2 0.2 or 1.0 E.U./dL   Nitrite, UA negative     Leukocytes, UA Negative Negative   Appearance     Odor     No results found.    Assessment and Plan: 47 y.o. male with   .[name] was seen today for cough.  Diagnoses and all orders for this visit:  Urethral discharge in male, without blood -     Trichomonas vaginalis, RNA -     C. trachomatis/N. gonorrhoeae RNA -     PSA -     Urinalysis w microscopic + reflex cultur -     Urinalysis, microscopic only  Proteinuria, unspecified type -     Urinalysis, microscopic only -     POCT Urinalysis Dipstick  Hypertension, unspecified type -     COMPLETE METABOLIC PANEL WITH GFR  Screening for blood disease -     CBC  Screening for lipid disorders -     Lipid Panel w/reflex Direct LDL  Screening PSA (prostate specific antigen) -     PSA  Acute upper respiratory infection -     ipratropium (ATROVENT) 0.06 % nasal spray; Place 2 sprays into both nostrils 4 (four) times daily as needed for rhinitis. -     benzonatate (TESSALON) 200 MG capsule; Take 1 capsule (200 mg total) by mouth 3 (three) times daily as needed for cough.     Acute upper respiratory infection Afebrile, no tachypnea, no tachycardia, pulse ox 97% on room air, no adventitious lung sounds Counseled on supportive care Atrovent and Tessalon prn  Chronic penile discharge Given chronic nature of this problem for over 10 years and no real symptoms of urethritis, I suspect this is retrograde ejaculation.  Gonorrhea chlamydia pending.  Urinalysis sent for microscopy today.  We will also check PSA since chronic prostatitis may cause similar symptoms, however patient denies lower urinary tract symptoms.  I also provided the patient with a sterile cup to perform a clean-catch post ejaculating at home.  If urinalysis does not show evidence of semen, will refer to urology.  Proteinuria, Elevated BP BP Readings from Last 3 Encounters:  03/10/18 136/88  11/10/17 (!) 146/80  11/06/17 104/76  Checking renal function BP  elevated at last 2 office visits. No prior hx of HTN.  Patient to monitor and log blood pressures at home Recheck UA in 2 weeks  Patient education and anticipatory guidance given Patient agrees with treatment plan Follow-up in 2 weeks for proteinuria and elevated blood pressure or sooner as needed if symptoms worsen or fail to improve  Darlyne Russian PA-C

## 2018-03-11 ENCOUNTER — Encounter: Payer: Self-pay | Admitting: Physician Assistant

## 2018-03-11 DIAGNOSIS — E782 Mixed hyperlipidemia: Secondary | ICD-10-CM

## 2018-03-11 HISTORY — DX: Mixed hyperlipidemia: E78.2

## 2018-03-11 LAB — CBC
HCT: 42.7 % (ref 38.5–50.0)
Hemoglobin: 14.9 g/dL (ref 13.2–17.1)
MCH: 30.7 pg (ref 27.0–33.0)
MCHC: 34.9 g/dL (ref 32.0–36.0)
MCV: 88 fL (ref 80.0–100.0)
MPV: 9 fL (ref 7.5–12.5)
Platelets: 238 10*3/uL (ref 140–400)
RBC: 4.85 Million/uL (ref 4.20–5.80)
RDW: 12.4 % (ref 11.0–15.0)
WBC: 5.8 10*3/uL (ref 3.8–10.8)

## 2018-03-11 LAB — TRICHOMONAS VAGINALIS RNA, QL,MALES: Trichomonas vaginalis RNA: NOT DETECTED

## 2018-03-11 LAB — COMPLETE METABOLIC PANEL WITH GFR
AG Ratio: 1.6 (calc) (ref 1.0–2.5)
ALBUMIN MSPROF: 4.8 g/dL (ref 3.6–5.1)
ALT: 27 U/L (ref 9–46)
AST: 21 U/L (ref 10–40)
Alkaline phosphatase (APISO): 61 U/L (ref 40–115)
BILIRUBIN TOTAL: 0.6 mg/dL (ref 0.2–1.2)
BUN: 11 mg/dL (ref 7–25)
CO2: 30 mmol/L (ref 20–32)
CREATININE: 1.26 mg/dL (ref 0.60–1.35)
Calcium: 9.4 mg/dL (ref 8.6–10.3)
Chloride: 103 mmol/L (ref 98–110)
GFR, EST AFRICAN AMERICAN: 79 mL/min/{1.73_m2} (ref >=60)
GFR, EST NON AFRICAN AMERICAN: 68 mL/min/{1.73_m2} (ref >=60)
GLUCOSE: 84 mg/dL (ref 65–99)
Globulin: 3 g/dL (calc) (ref 1.9–3.7)
Potassium: 4 mmol/L (ref 3.5–5.3)
Sodium: 142 mmol/L (ref 135–146)
TOTAL PROTEIN: 7.8 g/dL (ref 6.1–8.1)

## 2018-03-11 LAB — LIPID PANEL W/REFLEX DIRECT LDL
Cholesterol: 264 mg/dL — ABNORMAL HIGH
HDL: 42 mg/dL
LDL Cholesterol (Calc): 191 mg/dL — ABNORMAL HIGH
Non-HDL Cholesterol (Calc): 222 mg/dL — ABNORMAL HIGH
Total CHOL/HDL Ratio: 6.3 (calc) — ABNORMAL HIGH
Triglycerides: 149 mg/dL

## 2018-03-11 LAB — URINALYSIS, MICROSCOPIC ONLY
Bacteria, UA: NONE SEEN /HPF
Hyaline Cast: NONE SEEN /LPF
RBC / HPF: NONE SEEN /HPF (ref 0–2)
Squamous Epithelial / HPF: NONE SEEN /HPF (ref <=5)
WBC, UA: NONE SEEN /HPF (ref 0–5)

## 2018-03-11 LAB — PSA: PSA: 0.4 ng/mL (ref <=4.0)

## 2018-03-11 LAB — C. TRACHOMATIS/N. GONORRHOEAE RNA
C. trachomatis RNA, TMA: NOT DETECTED
N. gonorrhoeae RNA, TMA: NOT DETECTED

## 2018-03-29 ENCOUNTER — Ambulatory Visit (INDEPENDENT_AMBULATORY_CARE_PROVIDER_SITE_OTHER): Payer: 59 | Admitting: Physician Assistant

## 2018-03-29 ENCOUNTER — Encounter: Payer: Self-pay | Admitting: Physician Assistant

## 2018-03-29 VITALS — BP 123/77 | HR 80 | Wt 182.0 lb

## 2018-03-29 DIAGNOSIS — K2100 Gastro-esophageal reflux disease with esophagitis, without bleeding: Secondary | ICD-10-CM

## 2018-03-29 DIAGNOSIS — E782 Mixed hyperlipidemia: Secondary | ICD-10-CM

## 2018-03-29 DIAGNOSIS — R801 Persistent proteinuria, unspecified: Secondary | ICD-10-CM | POA: Diagnosis not present

## 2018-03-29 DIAGNOSIS — M791 Myalgia, unspecified site: Secondary | ICD-10-CM

## 2018-03-29 DIAGNOSIS — R944 Abnormal results of kidney function studies: Secondary | ICD-10-CM

## 2018-03-29 DIAGNOSIS — R030 Elevated blood-pressure reading, without diagnosis of hypertension: Secondary | ICD-10-CM

## 2018-03-29 LAB — POCT URINALYSIS DIPSTICK
Bilirubin, UA: NEGATIVE
Blood, UA: NEGATIVE
Glucose, UA: NEGATIVE
Ketones, UA: NEGATIVE
Leukocytes, UA: NEGATIVE
NITRITE UA: NEGATIVE
PH UA: 5.5 (ref 5.0–8.0)
Protein, UA: POSITIVE — AB
Spec Grav, UA: >=1.03 — AB (ref 1.010–1.025)
UROBILINOGEN UA: 0.2 U/dL

## 2018-03-29 MED ORDER — OMEPRAZOLE 20 MG PO CPDR: 20.0000 mg | DELAYED_RELEASE_CAPSULE | Freq: Every day | ORAL | 1 refills | Status: DC

## 2018-03-29 MED ORDER — OMEPRAZOLE 20 MG PO CPDR: 20.0000 mg | DELAYED_RELEASE_CAPSULE | Freq: Every day | ORAL | 1 refills | Status: DC | PRN

## 2018-03-29 MED ORDER — ATORVASTATIN CALCIUM 20 MG PO TABS: 20.0000 mg | ORAL_TABLET | Freq: Every day | ORAL | 3 refills | Status: DC

## 2018-03-29 NOTE — Patient Instructions (Signed)
Proteinuria    Proteinuria is when there is too much protein in the urine. Proteins are important for building muscles and bones. Proteins are also needed to fight infections, help the blood clot, and keep body fluids in balance.  Proteinuria may be mild and temporary, or it may be an early sign of kidney disease. The kidneys make urine. Healthy kidneys also keep substances like proteins from leaving the blood and ending up in the urine. Proteinuria may be a sign that the kidneys are not working well.  What are the causes?  Healthy kidneys have filters (glomeruli) that keep proteins out of the urine. Proteinuria may mean that the glomeruli are damaged. The main causes of this type of damage are:  · Diabetes.  · High blood pressure.  Other causes of kidney damage can also cause proteinuria, such as:  · Diseases of the immune system, such as lupus, rheumatoid arthritis, sarcoidosis, and Goodpasture syndrome.  · Heart disease or heart failure.  · Kidney infection.  · Certain cancers, including kidney cancer, lymphoma, leukemia, and multiple myeloma.  · Amyloidosis. This is a disease that causes abnormal proteins to build up in body tissues.  · Reactions to certain medicines, such as NSAIDs.  · Injury (trauma) or poisons (toxins).  · High blood pressure that occurs during pregnancy (preeclampsia).  Temporary proteinuria may result from conditions that put stress on the kidneys. These conditions usually do not cause kidney damage. They include:  · Fever.  · Exposure to cold or heat.  · Emotional or physical stress.  · Extreme exercise.  · Standing for long periods of time.  What increases the risk?  This condition is more likely to develop in people who:  · Have diabetes.  · Have high blood pressure.  · Have heart disease or heart failure.  · Have an immune disease, cancer, or other disease that affects the kidneys.  · Have a family history of kidney disease.  · Are 65 or older.  · Are overweight.  · Are of  African-American, American Indian, Hispanic/Latino, or [place] Islander descent.  · Are pregnant.  · Have an infection.  What are the signs or symptoms?  Mild proteinuria may not cause symptoms. As more proteins enter the urine, symptoms of kidney disease may develop, such as:  · Foamy urine.  · Swelling of the face, abdomen, hands, legs, or feet (edema).  · Needing to urinate frequently.  · Fatigue.  · Difficulty sleeping.  · Dry and itchy skin.  · Nausea and vomiting.  · Muscle cramps.  · Shortness of breath.  How is this diagnosed?  Your health care provider can diagnose proteinuria with a urine test. You may have this test as part of a routine physical or because you have symptoms of kidney disease or risk factors for kidney disease. You may also have:  · Blood tests to measure the level of a certain substance (creatinine) that increases with kidney disease.  · Imaging tests of your kidney, such as a CT scan or an ultrasound, to look for signs of kidney damage.  How is this treated?  If your proteinuria is mild or temporary, no treatment may be needed. Your health care provider may show you how to monitor the level of protein in your urine at home. Identifying proteinuria early is important so that the cause of the condition can be treated.  Treatment depends on the cause of your proteinuria. Treatment may include:  · Making diet and lifestyle changes.  ·   Getting blood pressure under control.  · Getting blood sugar under control, if you have diabetes.  · Managing any other medical conditions you have that affect your kidneys.  · Giving birth, if you are pregnant.  · Avoiding medicines that damage your kidneys.  · Treating kidney disease with medicine and dialysis, as needed.  Follow these instructions at home:  · Check your protein levels at home, if directed by your health care provider.  · Follow instructions from your health care provider about eating or drinking restrictions.  · Take over-the-counter and  prescription medicines only as told by your health care provider.  · Return to your normal activities as told by your health care provider. Ask your health care provider what activities are safe for you.  · If you are overweight, ask your health care provider to help you with a diet to get to a healthy weight.  · Ask your health care provider to help you with an exercise program.  · Keep all follow-up visits as told by your health care provider. This is important.  Contact a health care provider if:  · You have new symptoms.  · Your symptoms get worse or do not improve.  Get help right away if:  · You have back pain.  · You have diarrhea.  · You vomit.  · You have a fever.  · You have a rash.  This information is not intended to replace advice given to you by your health care provider. Make sure you discuss any questions you have with your health care provider.  Document Released: 04/30/2005 Document Revised: 04/20/2015 Document Reviewed: 01/29/2015  Elsevier Interactive Patient Education © 2019 [employer]

## 2018-03-29 NOTE — Progress Notes (Signed)
HPI:                                                                [name] [name] is a 48 y.o. male who presents to [place]: [place] today for follow-up on BP and proteinuria  At last OV, BP was elevated with proteinuria. Patient did not monitor his BP at home. No prior history of HTN  Labs also showed mixed hyperlipidemia with LDL of 191. Cholesterol-lowering medication was recommended in addition to dietary/lifestyle changes. He did not start medication.  Requesting refill of Omeprazole. Hx of Barrett's esophagus.   Past Medical History:  Diagnosis Date  . Allergy   . Barrett's esophagus determined by biopsy   . Chronic sinusitis   . GERD (gastroesophageal reflux disease)   . Helicobacter pylori gastritis   . Hiatal hernia   . Mixed hyperlipidemia 03/11/2018   Past Surgical History:  Procedure Laterality Date  . SINUS IRRIGATION    . UPPER GI ENDOSCOPY     Social History   Tobacco Use  . Smoking status: Never Smoker  . Smokeless tobacco: Never Used  Substance Use Topics  . Alcohol use: Yes    Alcohol/week: 14.0 standard drinks    Types: 14 Cans of beer per week   family history includes Colon cancer in his paternal aunt; Hypertension in his father; Stomach cancer in his brother; Throat cancer in his father.    ROS: negative except as noted in the HPI  Medications: Current Outpatient Medications  Medication Sig Dispense Refill  . fexofenadine (ALLEGRA) 180 MG tablet Take 1 tablet (180 mg total) by mouth at bedtime. 90 tablet 3  . omeprazole (PRILOSEC) 20 MG capsule TAKE 1 CAPSULE BY MOUTH EVERY DAY 90 capsule 0   No current facility-administered medications for this visit.    No Known Allergies     Objective:  BP 123/77   Pulse 80   Wt 182 lb (82.6 kg)   BMI 26.88 kg/m  Gen:  alert, not ill-appearing, no distress, appropriate for age 44: head normocephalic without obvious abnormality, conjunctiva and  cornea clear, trachea midline Pulm: Normal work of breathing, normal phonation Neuro: alert and oriented x 3, no tremor MSK: extremities atraumatic, normal gait and station, no peripheral edema Skin: intact, no rashes on exposed skin, no jaundice, no cyanosis Psych: well-groomed, cooperative, good eye contact, euthymic mood, affect mood-congruent, speech is articulate, and thought processes clear and goal-directed  Lab Results  Component Value Date   CREATININE 1.26 03/10/2018   BUN 11 03/10/2018   NA 142 03/10/2018   K 4.0 03/10/2018   CL 103 03/10/2018   CO2 30 03/10/2018   Estimated Creatinine Clearance: 74 mL/min (by C-G formula based on SCr of 1.26 mg/dL).  The 10-year ASCVD risk score Mikey Bussing DC Brooke Bonito., et al., 2013) is: 3.8%   Values used to calculate the score:     Age: 50 years     Sex: Male     Is Non-Hispanic African American: No     Diabetic: No     Tobacco smoker: No     Systolic Blood Pressure: 163 mmHg     Is BP treated: No     HDL Cholesterol: 42 mg/dL  Total Cholesterol: 264 mg/dL   BP Readings from Last 3 Encounters:  03/29/18 123/77  03/10/18 136/88  11/10/17 (!) 146/80      Assessment and Plan: 48 y.o. male with   .[name] [name] was seen today for follow-up.  Diagnoses and all orders for this visit:  Persistent proteinuria -     POCT Urinalysis Dipstick -     Protein, Urine, 24 hour -     US Renal; Future  Myalgia -     CK  Decreased calculated GFR  Mixed hyperlipidemia -     atorvastatin (LIPITOR) 20 MG tablet; Take 1 tablet (20 mg total) by mouth daily.  GERD with esophagitis -     omeprazole (PRILOSEC) 20 MG capsule; Take 1 capsule (20 mg total) by mouth daily as needed.   Personally reviewed lab results with patient and answered all questions  Persistent proteinuria - mildly decreased GFR with normal BUN/SCr and trace proteinuria on 1 month recheck today -Blood pressure is in range - renal US and 24 hr urine protein pending to assess  for nephritis/nephrotic syndrome - repeat renal function in 3 months  Mixed hyperlipidemia LDL greater than 160.  Moderate intensity statin therapy recommended.  Patient was amenable to this.  Starting atorvastatin 20 mg nightly.  Counseled on low-fat heart healthy diet  While discussing common side effects of statin therapy, patient revealed that he occasionally has generalized myalgias that are not related to exercise or exertion.  Checking CK level  Patient education and anticipatory guidance given Patient agrees with treatment plan Follow-up in 3 months or sooner as needed if symptoms worsen or fail to improve  Darlyne Russian PA-C

## 2018-03-30 LAB — CK: Total CK: 129 U/L (ref 44–196)

## 2018-04-02 LAB — PROTEIN, URINE, 24 HOUR: Protein, 24H Urine: 115 mg/24 h (ref 0–149)

## 2018-04-02 NOTE — Progress Notes (Signed)
Protein in urine is normal. How is patient feeling?

## 2018-04-05 ENCOUNTER — Other Ambulatory Visit: Payer: Self-pay | Admitting: Physician Assistant

## 2018-04-05 DIAGNOSIS — J069 Acute upper respiratory infection, unspecified: Secondary | ICD-10-CM

## 2018-04-09 ENCOUNTER — Other Ambulatory Visit: Payer: 59

## 2018-04-09 ENCOUNTER — Other Ambulatory Visit: Payer: Self-pay | Admitting: Physician Assistant

## 2018-04-09 DIAGNOSIS — R801 Persistent proteinuria, unspecified: Secondary | ICD-10-CM

## 2018-04-10 ENCOUNTER — Ambulatory Visit (HOSPITAL_BASED_OUTPATIENT_CLINIC_OR_DEPARTMENT_OTHER): Payer: 59

## 2018-04-10 ENCOUNTER — Ambulatory Visit (HOSPITAL_BASED_OUTPATIENT_CLINIC_OR_DEPARTMENT_OTHER)
Admission: RE | Admit: 2018-04-10 | Discharge: 2018-04-10 | Disposition: A | Discharge status: Home / Self Care | Payer: 59 | Source: Ambulatory Visit | Attending: Physician Assistant | Admitting: Physician Assistant

## 2018-04-10 DIAGNOSIS — R801 Persistent proteinuria, unspecified: Secondary | ICD-10-CM | POA: Insufficient documentation

## 2018-05-13 ENCOUNTER — Ambulatory Visit (INDEPENDENT_AMBULATORY_CARE_PROVIDER_SITE_OTHER): Payer: 59 | Admitting: Osteopathic Medicine

## 2018-05-13 ENCOUNTER — Encounter: Payer: Self-pay | Admitting: Osteopathic Medicine

## 2018-05-13 VITALS — BP 133/78 | HR 87 | Temp 98.8°F | Wt 188.3 lb

## 2018-05-13 DIAGNOSIS — R0981 Nasal congestion: Secondary | ICD-10-CM

## 2018-05-13 DIAGNOSIS — R6889 Other general symptoms and signs: Secondary | ICD-10-CM | POA: Diagnosis not present

## 2018-05-13 DIAGNOSIS — R059 Cough, unspecified: Secondary | ICD-10-CM

## 2018-05-13 DIAGNOSIS — R05 Cough: Secondary | ICD-10-CM

## 2018-05-13 LAB — POCT INFLUENZA A/B
Influenza A, POC: NEGATIVE
Influenza B, POC: NEGATIVE

## 2018-05-13 MED ORDER — BALOXAVIR MARBOXIL(80 MG DOSE) 2 X 40 MG PO TBPK: 80.0000 mg | ORAL_TABLET | Freq: Once | ORAL | 0 refills | Status: AC

## 2018-05-13 MED ORDER — IPRATROPIUM BROMIDE 0.06 % NA SOLN: 2.0000 | Freq: Four times a day (QID) | NASAL | 1 refills | Status: DC

## 2018-05-13 MED ORDER — OSELTAMIVIR PHOSPHATE 75 MG PO CAPS: 75.0000 mg | ORAL_CAPSULE | Freq: Two times a day (BID) | ORAL | 0 refills | Status: AC

## 2018-05-13 MED ORDER — GUAIFENESIN-CODEINE 100-10 MG/5ML PO SYRP: 5.0000 mL | ORAL_SOLUTION | Freq: Three times a day (TID) | ORAL | 0 refills | Status: DC | PRN

## 2018-05-13 NOTE — Progress Notes (Signed)
HPI: [name] [name] is a 48 y.o. male who  has a past medical history of Allergy, Barrett's esophagus determined by biopsy, Chronic sinusitis, GERD (gastroesophageal reflux disease), Helicobacter pylori gastritis, Hiatal hernia, and Mixed hyperlipidemia (03/11/2018).  he presents to [facility] today, 05/13/18,  for chief complaint of: Sick - URI concern, fever   . Location & Quality: sinuses, chest: cough, congestion, fever, aches . Duration: started today . Timing: constant  . Modifying factors: no meds tried . Assoc signs/symptoms: GERD last night . NO flu vaccine this season       Past medical, surgical, social and family history reviewed and updated as necessary.   Current medication list and allergy/intolerance information reviewed:    Current Outpatient Medications  Medication Sig Dispense Refill  . atorvastatin (LIPITOR) 20 MG tablet Take 1 tablet (20 mg total) by mouth daily. 90 tablet 3  . fexofenadine (ALLEGRA) 180 MG tablet Take 1 tablet (180 mg total) by mouth at bedtime. 90 tablet 3  . omeprazole (PRILOSEC) 20 MG capsule Take 1 capsule (20 mg total) by mouth daily. 90 capsule 1  . Baloxavir Marboxil,80 MG Dose, (XOFLUZA) 2 x 40 MG TBPK Take 80 mg by mouth once for 1 dose. 2 each 0  . guaiFENesin-codeine (ROBITUSSIN AC) 100-10 MG/5ML syrup Take 5-10 mLs by mouth 3 (three) times daily as needed for cough. 180 mL 0  . ipratropium (ATROVENT) 0.06 % nasal spray Place 2 sprays into both nostrils 4 (four) times daily. 15 mL 1  . oseltamivir (TAMIFLU) 75 MG capsule Take 1 capsule (75 mg total) by mouth 2 (two) times daily for 5 days. 10 capsule 0   No current facility-administered medications for this visit.     No Known Allergies    Review of Systems:  Constitutional:  +fever, no chills, +recent illness, No unintentional weight changes. +significant fatigue.   HEENT: No  headache, no vision change, no hearing change, +sore throat,  +sinus pressure  Cardiac: No  chest pain, No  pressure, No palpitations  Respiratory:  No  shortness of breath. +Cough  Gastrointestinal: +abdominal pain, No  nausea, No  vomiting,  No  blood in stool, No  diarrhea  Musculoskeletal: No new myalgia/arthralgia  Skin: No  Rash  Neurologic: No  weakness, No  dizziness  Exam:  BP 133/78 (BP Location: Left Arm, Patient Position: Sitting, Cuff Size: Normal)   Pulse 87   Temp 98.8 F (37.1 C) (Oral)   Wt 188 lb 4.8 oz (85.4 kg)   SpO2 98%   BMI 27.81 kg/m   Constitutional: VS see above. General Appearance: alert, well-developed, well-nourished, NAD  Eyes: Normal lids and conjunctive, non-icteric sclera  Ears, Nose, Mouth, Throat: MMM, Normal external inspection ears/nares/mouth/lips/gums. TM normal bilaterally. Pharynx/tonsils +erythema, no exudate. Nasal mucosa normal.   Neck: No masses, trachea midline. No tenderness/mass appreciated. No lymphadenopathy  Respiratory: Normal respiratory effort. no wheeze, no rhonchi, no rales  Cardiovascular: S1/S2 normal, no murmur, no rub/gallop auscultated. RRR. No lower extremity edema.   Gastrointestinal: Nontender, no masses. Bowel sounds normal.  Musculoskeletal: Gait normal.   Neurological: Normal balance/coordination. No tremor.   Skin: warm, dry, intact.   Psychiatric: Normal judgment/insight. Normal mood and affect.   Results for orders placed or performed in visit on 05/13/18 (from the past 24 hour(s))  POCT Influenza A/B     Status: None   Collection Time: 05/13/18 11:43 AM  Result Value Ref Range   Influenza A, POC Negative Negative  Influenza B, POC Negative Negative        ASSESSMENT/PLAN: The primary encounter diagnosis was Flu-like symptoms. Diagnoses of Cough and Sinus congestion were also pertinent to this visit.   Printed Rx for Tamiflu and Xofluza, can fill whichever cheapest   Neg flu swab but strong suspicion given sudden onset w/ fever and body aches,  will go ahead and treat, no at-risk household contacts   Meds ordered this encounter  Medications  . oseltamivir (TAMIFLU) 75 MG capsule    Sig: Take 1 capsule (75 mg total) by mouth 2 (two) times daily for 5 days.    Dispense:  10 capsule    Refill:  0  . Baloxavir Marboxil,80 MG Dose, (XOFLUZA) 2 x 40 MG TBPK    Sig: Take 80 mg by mouth once for 1 dose.    Dispense:  2 each    Refill:  0  . ipratropium (ATROVENT) 0.06 % nasal spray    Sig: Place 2 sprays into both nostrils 4 (four) times daily.    Dispense:  15 mL    Refill:  1  . guaiFENesin-codeine (ROBITUSSIN AC) 100-10 MG/5ML syrup    Sig: Take 5-10 mLs by mouth 3 (three) times daily as needed for cough.    Dispense:  180 mL    Refill:  0    Orders Placed This Encounter  Procedures  . POCT Influenza A/B    Patient Instructions  Medications & Home Remedies for Upper Respiratory Illness   Note: the following list assumes no pregnancy, normal liver & kidney function and no other drug interactions. Dr. Sheppard Coil has highlighted medications which are safe for you to use, but these may not be appropriate for everyone. Always ask a pharmacist or qualified medical provider if you have any questions!    Aches/Pains, Fever, Headache OTC Acetaminophen (Tylenol) 500 mg tablets - take max 2 tablets (1000 mg) every 6 hours (4 times per day)  OTC Ibuprofen (Motrin) 200 mg tablets - take max 4 tablets (800 mg) every 6 hours   Sinus Congestion Prescription Atrovent Nasal Spray as directed OTC Nasal Saline if desired to rinse OTC Oxymetolazone (Afrin, others) sparing use due to rebound congestion, NEVER use in kids OTC Phenylephrine (Sudafed) 10 mg tablets every 4 hours (or the 12-hour formulation) OTC Diphenhydramine (Benadryl) 25 mg tablets - take max 2 tablets every 4 hours   Cough & Sore Throat Prescription Guaifenesin-Codeine syrup as directed (careful not to duplicate with OTC Guaifenesin)  OTC Dextromethorphan (Robitussin,  others) - cough suppressant OTC Guaifenesin (Robitussin, Mucinex, others) - expectorant (helps cough up mucus) (Dextromethorphan and Guaifenesin also come in a combination tablet/syrup) OTC Lozenges w/ Benzocaine + Menthol (Cepacol) Honey - as much as you want! Teas which "coat the throat" - look for ingredients Elm Bark, Licorice Root, Marshmallow Root   Other OTC Zinc Lozenges within 24 hours of symptoms onset - mixed evidence this shortens the duration of the common cold Don't waste your money on Vitamin C or Echinacea in acute illness - it's already too late!            Visit summary with medication list and pertinent instructions was printed for patient to review. All questions at time of visit were answered - patient instructed to contact office with any additional concerns or updates. ER/RTC precautions were reviewed with the patient.   Follow-up plan: Return if symptoms worsen or fail to improve.    Please note: voice recognition software was used to  produce this document, and typos may escape review. Please contact Dr. Sheppard Coil for any needed clarifications.

## 2018-05-13 NOTE — Patient Instructions (Addendum)
Medications & Home Remedies for Upper Respiratory Illness   Note: the following list assumes no pregnancy, normal liver & kidney function and no other drug interactions. Dr. Sheppard Coil has highlighted medications which are safe for you to use, but these may not be appropriate for everyone. Always ask a pharmacist or qualified medical provider if you have any questions!    Aches/Pains, Fever, Headache OTC Acetaminophen (Tylenol) 500 mg tablets - take max 2 tablets (1000 mg) every 6 hours (4 times per day)  OTC Ibuprofen (Motrin) 200 mg tablets - take max 4 tablets (800 mg) every 6 hours   Sinus Congestion Prescription Atrovent Nasal Spray as directed OTC Nasal Saline if desired to rinse OTC Oxymetolazone (Afrin, others) sparing use due to rebound congestion, NEVER use in kids OTC Phenylephrine (Sudafed) 10 mg tablets every 4 hours (or the 12-hour formulation) OTC Diphenhydramine (Benadryl) 25 mg tablets - take max 2 tablets every 4 hours   Cough & Sore Throat Prescription Guaifenesin-Codeine syrup as directed (careful not to duplicate with OTC Guaifenesin)  OTC Dextromethorphan (Robitussin, others) - cough suppressant OTC Guaifenesin (Robitussin, Mucinex, others) - expectorant (helps cough up mucus) (Dextromethorphan and Guaifenesin also come in a combination tablet/syrup) OTC Lozenges w/ Benzocaine + Menthol (Cepacol) Honey - as much as you want! Teas which "coat the throat" - look for ingredients Elm Bark, Licorice Root, Marshmallow Root   Other OTC Zinc Lozenges within 24 hours of symptoms onset - mixed evidence this shortens the duration of the common cold Don't waste your money on Vitamin C or Echinacea in acute illness - it's already too late!

## 2018-06-04 ENCOUNTER — Encounter: Payer: Self-pay | Admitting: Emergency Medicine

## 2018-06-04 ENCOUNTER — Emergency Department (INDEPENDENT_AMBULATORY_CARE_PROVIDER_SITE_OTHER)
Admission: EM | Admit: 2018-06-04 | Discharge: 2018-06-04 | Disposition: A | Discharge status: Home / Self Care | Payer: 59 | Source: Home / Self Care | Attending: Family Medicine | Admitting: Family Medicine

## 2018-06-04 ENCOUNTER — Other Ambulatory Visit: Payer: Self-pay

## 2018-06-04 DIAGNOSIS — R69 Illness, unspecified: Secondary | ICD-10-CM | POA: Diagnosis not present

## 2018-06-04 DIAGNOSIS — J111 Influenza due to unidentified influenza virus with other respiratory manifestations: Secondary | ICD-10-CM

## 2018-06-04 MED ORDER — OSELTAMIVIR PHOSPHATE 75 MG PO CAPS: 75.0000 mg | ORAL_CAPSULE | Freq: Two times a day (BID) | ORAL | 0 refills | Status: DC

## 2018-06-04 NOTE — ED Triage Notes (Signed)
Pt c/o body aches, cough and nasal drainage that started yesterday. He started taking amox yesterday for tooth infection. Afebrile.

## 2018-06-04 NOTE — Discharge Instructions (Addendum)
Take plain guaifenesin (1200mg  extended release tabs such as Mucinex) twice daily, with plenty of water, for cough and congestion.  May add Pseudoephedrine (30mg , one or two every 4 to 6 hours) for sinus congestion.  Get adequate rest.   May use Afrin nasal spray (or generic oxymetazoline) each morning for about 5 days and then discontinue.  Also recommend using saline nasal spray several times daily and saline nasal irrigation (AYR is a common brand).  Use Flonase nasal spray each morning after using Afrin nasal spray and saline nasal irrigation. Try warm salt water gargles for sore throat.  Stop all antihistamines for now, and other non-prescription cough/cold preparations. May take Ibuprofen 200mg , 4 tabs every 8 hours with food for body aches, headache, fever, etc. May take Delsym Cough Suppressant at bedtime for nighttime cough.

## 2018-06-04 NOTE — ED Provider Notes (Signed)
[name]    CSN: [id number] Arrival date & time: 06/04/18  1612     History   Chief Complaint Chief Complaint  Patient presents with  . Generalized Body Aches    HPI [name] [name] is a 48 y.o. male.   Patient has a tooth infection (right upper) for which he has been prescribed amoxicillin.  Yesterday he developed myalgias, nasal congestion, headache, minimal sore throat, and fatigue.  He feels tightness in his anterior chest.  The history is provided by the patient.    Past Medical History:  Diagnosis Date  . Allergy   . Barrett's esophagus determined by biopsy   . Chronic sinusitis   . GERD (gastroesophageal reflux disease)   . Helicobacter pylori gastritis   . Hiatal hernia   . Mixed hyperlipidemia 03/11/2018    Patient Active Problem List   Diagnosis Date Noted  . Decreased calculated GFR 03/29/2018  . Mixed hyperlipidemia 03/11/2018  . Proteinuria 03/10/2018  . Urethral discharge in male, without blood 03/10/2018  . Barrett's esophagus without dysplasia 10/08/2017  . History of Helicobacter pylori infection 10/08/2017  . Chronic rhinitis 10/08/2017  . Bilateral impacted cerumen 10/08/2017  . Acute pain of right shoulder 07/17/2016  . GERD with esophagitis 07/17/2016  . Elevated blood pressure reading 07/17/2016  . Chronic maxillary sinusitis 04/09/2015    Past Surgical History:  Procedure Laterality Date  . SINUS IRRIGATION    . UPPER GI ENDOSCOPY         Home Medications    Prior to Admission medications   Medication Sig Start Date End Date Taking? Authorizing Provider  atorvastatin (LIPITOR) 20 MG tablet Take 1 tablet (20 mg total) by mouth daily. 03/29/18   Trixie Dredge, PA-C  fexofenadine (ALLEGRA) 180 MG tablet Take 1 tablet (180 mg total) by mouth at bedtime. 10/08/17   Trixie Dredge, PA-C  guaiFENesin-codeine [facility]) 100-10 MG/5ML syrup Take 5-10 mLs by mouth 3 (three) times daily as needed  for cough. 05/13/18   Emeterio Reeve, DO  ipratropium (ATROVENT) 0.06 % nasal spray Place 2 sprays into both nostrils 4 (four) times daily. 05/13/18   Emeterio Reeve, DO  omeprazole (PRILOSEC) 20 MG capsule Take 1 capsule (20 mg total) by mouth daily. 03/29/18   Trixie Dredge, PA-C  oseltamivir (TAMIFLU) 75 MG capsule Take 1 capsule (75 mg total) by mouth every 12 (twelve) hours. 06/04/18   Kandra Nicolas, MD    Family History Family History  Problem Relation Age of Onset  . Hypertension Father   . Throat cancer Father   . Stomach cancer Brother   . Colon cancer Paternal Aunt   . Rectal cancer Neg Hx     Social History Social History   Tobacco Use  . Smoking status: Never Smoker  . Smokeless tobacco: Never Used  Substance Use Topics  . Alcohol use: Yes    Alcohol/week: 14.0 standard drinks    Types: 14 Cans of beer per week  . Drug use: No     Allergies   Patient has no known allergies.   Review of Systems Review of Systems + minimal sore throat + cough No pleuritic pain but feels tight in anterior chest No wheezing + nasal congestion + post-nasal drainage No sinus pain/pressure No itchy/red eyes No earache No hemoptysis No SOB No fever/chills No nausea No vomiting No abdominal pain No diarrhea No urinary symptoms No skin rash + fatigue + myalgias + headache Used OTC meds without  relief   Physical Exam Triage Vital Signs ED Triage Vitals  Enc Vitals Group     BP 06/04/18 1656 (!) 138/56     Pulse Rate 06/04/18 1656 90     Resp --      Temp 06/04/18 1656 98.4 F (36.9 C)     Temp Source 06/04/18 1656 Oral     SpO2 06/04/18 1656 98 %     Weight 06/04/18 1658 186 lb (84.4 kg)     Height --      Head Circumference --      Peak Flow --      Pain Score 06/04/18 1658 0     Pain Loc --      Pain Edu? --      Excl. in [place]? --    No data found.  Updated Vital Signs BP (!) 138/56 (BP Location: Right Arm)   Pulse 90   Temp 98.4  F (36.9 C) (Oral)   Wt 84.4 kg   SpO2 98%   BMI 27.47 kg/m   Visual Acuity Right Eye Distance:   Left Eye Distance:   Bilateral Distance:    Right Eye Near:   Left Eye Near:    Bilateral Near:     Physical Exam Nursing notes and Vital Signs reviewed. Appearance:  Patient appears stated age, and in no acute distress Eyes:  Pupils are equal, round, and reactive to light and accomodation.  Extraocular movement is intact.  Conjunctivae are not inflamed  Ears:  Canals normal.  Tympanic membranes normal.  Nose:  Mildly congested turbinates.  No sinus tenderness.  Pharynx:  Normal Neck:  Supple.  Enlarged posterior/lateral nodes are palpated bilaterally, tender to palpation on the left.   Lungs:  Clear to auscultation.  Breath sounds are equal.  Moving air well. Heart:  Regular rate and rhythm without murmurs, rubs, or gallops.  Abdomen:  Nontender without masses or hepatosplenomegaly.  Bowel sounds are present.  No CVA or flank tenderness.  Extremities:  No edema.  Skin:  No rash present.    UC Treatments / Results  Labs (all labs ordered are listed, but only abnormal results are displayed) Labs Reviewed - No data to display  EKG None  Radiology No results found.  Procedures Procedures (including critical care time)  Medications Ordered in UC Medications - No data to display  Initial Impression / Assessment and Plan / UC Course  I have reviewed the triage vital signs and the nursing notes.  Pertinent labs & imaging results that were available during my care of the patient were reviewed by me and considered in my medical decision making (see chart for details).    There is no evidence of bacterial infection today (note patient is already on amoxicillin for dental infection). Begin Tamiflu. Followup with Family Doctor if not improved in one week.    Final Clinical Impressions(s) / UC Diagnoses   Final diagnoses:  Influenza-like illness     Discharge  Instructions     Take plain guaifenesin (1200mg  extended release tabs such as Mucinex) twice daily, with plenty of water, for cough and congestion.  May add Pseudoephedrine (30mg , one or two every 4 to 6 hours) for sinus congestion.  Get adequate rest.   May use Afrin nasal spray (or generic oxymetazoline) each morning for about 5 days and then discontinue.  Also recommend using saline nasal spray several times daily and saline nasal irrigation (AYR is a common brand).  Use Flonase nasal spray  each morning after using Afrin nasal spray and saline nasal irrigation. Try warm salt water gargles for sore throat.  Stop all antihistamines for now, and other non-prescription cough/cold preparations. May take Ibuprofen 200mg , 4 tabs every 8 hours with food for body aches, headache, fever, etc. May take Delsym Cough Suppressant at bedtime for nighttime cough.     ED Prescriptions    Medication Sig Dispense Auth. Provider   oseltamivir (TAMIFLU) 75 MG capsule Take 1 capsule (75 mg total) by mouth every 12 (twelve) hours. 10 capsule Kandra Nicolas, MD        Kandra Nicolas, MD 06/08/18 [phone]

## 2018-06-10 ENCOUNTER — Ambulatory Visit (INDEPENDENT_AMBULATORY_CARE_PROVIDER_SITE_OTHER): Payer: 59 | Admitting: Family Medicine

## 2018-06-10 ENCOUNTER — Other Ambulatory Visit: Payer: Self-pay

## 2018-06-10 ENCOUNTER — Encounter: Payer: Self-pay | Admitting: Family Medicine

## 2018-06-10 VITALS — BP 136/85 | HR 76 | Temp 98.3°F | Resp 16 | Ht 69.0 in | Wt 186.0 lb

## 2018-06-10 DIAGNOSIS — R05 Cough: Secondary | ICD-10-CM | POA: Diagnosis not present

## 2018-06-10 DIAGNOSIS — J22 Unspecified acute lower respiratory infection: Secondary | ICD-10-CM | POA: Diagnosis not present

## 2018-06-10 DIAGNOSIS — R059 Cough, unspecified: Secondary | ICD-10-CM

## 2018-06-10 DIAGNOSIS — R6889 Other general symptoms and signs: Secondary | ICD-10-CM

## 2018-06-10 LAB — POCT INFLUENZA A/B
INFLUENZA B, POC: NEGATIVE
Influenza A, POC: NEGATIVE

## 2018-06-10 MED ORDER — ALBUTEROL SULFATE HFA 108 (90 BASE) MCG/ACT IN AERS: 2.0000 | INHALATION_SPRAY | Freq: Four times a day (QID) | RESPIRATORY_TRACT | 0 refills | Status: DC | PRN

## 2018-06-10 MED ORDER — GUAIFENESIN-CODEINE 100-10 MG/5ML PO SYRP: 5.0000 mL | ORAL_SOLUTION | Freq: Three times a day (TID) | ORAL | 0 refills | Status: DC | PRN

## 2018-06-10 NOTE — Patient Instructions (Addendum)
Thank you for coming in today.  I will let you know asap about the caronavirus test.  Use the cough medicines as needed.  Muccinex DM and menthol containing cough drops.  Tylenol for pain.  Treat yourself like you do have COVID-19 until cleared.   Call or go to the emergency room if you get worse, have trouble breathing, have chest pains, or palpitations.        Person Under Monitoring Name: [name] [name]  Location: [place] [address]   CORONAVIRUS DISEASE 2019 (COVID-19) Guidance for Persons Under Investigation You are being tested for the virus that causes coronavirus disease 2019 (COVID-19). Public health actions are necessary to ensure protection of your health and the health of others, and to prevent further spread of infection. COVID-19 is caused by a virus that can cause symptoms, such as fever, cough, and shortness of breath. The primary transmission from person to person is by coughing or sneezing. On April 22, 2018, the [place] announced a [employer] Emergency of International Concern and on April 23, 2018 the U.[place]. Department of Health and Human Services declared a public health emergency. If the virus that causesCOVID-19 spreads in the community, it could have severe public health consequences.  As a person under investigation for COVID-19, the [facility] advises you to adhere to the following guidance until your test results are reported to you. If your test result is positive, you will receive additional information from your provider and your local health department at that time.   Remain at home until you are cleared by your health provider or public health authorities.   Keep a log of visitors to your home using the form provided. Any visitors to your home must be aware of your isolation status.  If you plan to move to a new address or leave the  county, notify the local health department in your county.  Call a doctor or seek care if you have an urgent medical need. Before seeking medical care, call ahead and get instructions from the provider before arriving at the medical office, clinic or hospital. Notify them that you are being tested for the virus that causes COVID-19 so arrangements can be made, as necessary, to prevent transmission to others in the healthcare setting. Next, notify the local health department in your county.  If a medical emergency arises and you need to call 911, inform the first responders that you are being tested for the virus that causes COVID-19. Next, notify the local health department in your county.  Adhere to all guidance set forth by the [place] for [facility] of patients that is based on guidance from the Center for Disease Control and Prevention with suspected or confirmed COVID-19. It is provided with this guidance for Persons Under Investigation.  Your health and the health of our community are our top priorities. Public Health officials remain available to provide assistance and counseling to you about COVID-19 and compliance with this guidance.  Provider: ____________________________________________________________ Date: ______/_____/_________  By signing below, you acknowledge that you have read and agree to comply with this Guidance for Persons Under Investigation. ______________________________________________________________ Date: ______/_____/_________  WHO DO I CALL? You can find a list of local health departments here: [email or web address] Health Department: ____________________________________________________________________ Contact Name: ________________________________________________________________________ Telephone: ___________________________________________________________________________  Marice Potter, [place], Communicable Disease Branch COVID-19 Guidance for Persons Under  Investigation May 29, 2018  Person Under Monitoring Name: [name] [name]  Location: [place] [address]   Record here the list of visitors to your home since you became ill with respiratory symptoms that led you to consult a health provider:  Visitor Name Date Time In Time Out Did this person come within 6 feet of you? Indicate Y or N Relationship to Person Under Monitoring Phone number Comments   ___/____/____ __:__ AM/PM __:__ AM/PM       ___/____/____ __:__ AM/PM __:__ AM/PM       ___/____/____ __:__ AM/PM __:__ AM/PM       ___/____/____ __:__ AM/PM __:__ AM/PM       ___/____/____ __:__ AM/PM __:__ AM/PM       ___/____/____ __:__ AM/PM __:__ AM/PM       ___/____/____ __:__ AM/PM __:__ AM/PM       ___/____/____ __:__ AM/PM __:__ AM/PM       ___/____/____ __:__ AM/PM __:__ AM/PM       ___/____/____ __:__ AM/PM __:__ AM/PM       ___/____/____ __:__ AM/PM __:__ AM/PM       ___/____/____ __:__ AM/PM __:__ AM/PM       ___/____/____ __:__ AM/PM __:__ AM/PM       ___/____/____ __:__ AM/PM __:__ AM/PM       Marice Potter, [place], Communicable Disease Branch

## 2018-06-10 NOTE — Addendum Note (Signed)
Addended by: Gregor Hams on: 06/10/2018 04:30 PM   Modules accepted: Orders

## 2018-06-10 NOTE — Progress Notes (Addendum)
[name] [name] is a 48 y.o. male who presents to [facility] [place] today for cough and congestion body aches fevers and chills.  Symptoms started Friday, March 12.  He developed a dry nonproductive cough on Saturday, March 13.  He has a little bit of shortness of breath with exertion.  He notes he has sick contacts at home.  He has 4 children [name] [name] 30, [name] [name] 11, [name] [name] 10, [name] [name] 3 were sick last week with a viral illness and are better now.  His mother-in-law who lives with him [name] [name] 08/12[birth year]1952 was seen in our clinic by [name] [name] on March 11 and 16 for febrile viral illness.  She she was not tested for influenza or for COVID-19 she was low risk for both.  [name] [name] was seen in urgent care on March 13 where he was thought to have influenza-like illness.  Influenza test was not obtained due to limited availability of test.  He was empirically prescribed Tamiflu and was prescribed codeine-based cough syrup for cough suppression.  He did not take the Tamiflu as he was concerned about potential side effects.  He has taken the codeine cough syrup which he finds to be helpful.  He notes that he does have Tessalon Perles at home but has not taken them for this episode.  He is tried some other over-the-counter medications which have helped only a little.  No vomiting diarrhea chest pain or palpitations.  No leg swelling.  Patient was sent home from work today.   ROS as above:  Exam:  BP 136/85   Pulse 76   Temp 98.3 F (36.8 C)   Resp 16   Ht 5\' 9"  (1.753 m)   Wt 186 lb (84.4 kg)   SpO2 97%   BMI 27.47 kg/m  Wt Readings from Last 5 Encounters:  06/10/18 186 lb (84.4 kg)  06/04/18 186 lb (84.4 kg)  05/13/18 188 lb 4.8 oz (85.4 kg)  03/29/18 182 lb (82.6 kg)  03/10/18 187 lb (84.8 kg)    Gen: Well NAD HEENT: EOMI,  MMM inflamed nasal turbinates bilaterally.  Posterior  pharynx with mild cobblestoning.  Mild cervical lymphadenopathy. Lungs: Normal work of breathing.  Slight coarse breath sounds bilaterally. Heart: RRR no MRG Abd: NABS, Soft. Nondistended, Nontender Exts: Brisk capillary refill, warm and well perfused.   Lab and Radiology Results Results for orders placed or performed in visit on 06/10/18 (from the past 72 hour(s))  POCT Influenza A/B     Status: Normal   Collection Time: 06/10/18  3:32 PM  Result Value Ref Range   Influenza A, POC Negative Negative   Influenza B, POC Negative Negative       Assessment and Plan: 48 y.o. male with viral illness.  Point-of-care influenza test was negative .  Plan to test for COVID-19 based on his symptom course and multiple sick contacts.  Additionally will treat empirically with over-the-counter medications as needed for symptom management.  Will use existing prescription of Tessalon Perles.  Will use codeine-based cough syrup as needed.  Additionally will use albuterol inhaler as this may help some.  Reviewed isolation precautions and quarantine procedures per CDC guidelines.  These were provided on the after visit summary as well as a independent handout.  Also discussed home visitor log.  Work note provided as well.  Precautions reviewed regarding worsening.  PDMP reviewed during this encounter. Orders Placed This Encounter  Procedures  . Coronavirus CoVID-19 (  [facility])  . POCT Influenza A/B   Meds ordered this encounter  Medications  . guaiFENesin-codeine (ROBITUSSIN AC) 100-10 MG/5ML syrup    Sig: Take 5-10 mLs by mouth 3 (three) times daily as needed for cough.    Dispense:  180 mL    Refill:  0  . albuterol (PROVENTIL HFA;VENTOLIN HFA) 108 (90 Base) MCG/ACT inhaler    Sig: Inhale 2 puffs into the lungs every 6 (six) hours as needed for wheezing or shortness of breath.    Dispense:  1 Inhaler    Refill:  0     Historical information moved to improve visibility of documentation.  Past  Medical History:  Diagnosis Date  . Allergy   . Barrett's esophagus determined by biopsy   . Chronic sinusitis   . GERD (gastroesophageal reflux disease)   . Helicobacter pylori gastritis   . Hiatal hernia   . Mixed hyperlipidemia 03/11/2018   Past Surgical History:  Procedure Laterality Date  . SINUS IRRIGATION    . UPPER GI ENDOSCOPY     Social History   Tobacco Use  . Smoking status: Never Smoker  . Smokeless tobacco: Never Used  Substance Use Topics  . Alcohol use: Yes    Alcohol/week: 14.0 standard drinks    Types: 14 Cans of beer per week   family history includes Colon cancer in his paternal aunt; Hypertension in his father; Stomach cancer in his brother; Throat cancer in his father.  Medications: Current Outpatient Medications  Medication Sig Dispense Refill  . albuterol (PROVENTIL HFA;VENTOLIN HFA) 108 (90 Base) MCG/ACT inhaler Inhale 2 puffs into the lungs every 6 (six) hours as needed for wheezing or shortness of breath. 1 Inhaler 0  . atorvastatin (LIPITOR) 20 MG tablet Take 1 tablet (20 mg total) by mouth daily. 90 tablet 3  . fexofenadine (ALLEGRA) 180 MG tablet Take 1 tablet (180 mg total) by mouth at bedtime. 90 tablet 3  . guaiFENesin-codeine (ROBITUSSIN AC) 100-10 MG/5ML syrup Take 5-10 mLs by mouth 3 (three) times daily as needed for cough. 180 mL 0  . ipratropium (ATROVENT) 0.06 % nasal spray Place 2 sprays into both nostrils 4 (four) times daily. 15 mL 1  . omeprazole (PRILOSEC) 20 MG capsule Take 1 capsule (20 mg total) by mouth daily. 90 capsule 1   No current facility-administered medications for this visit.    No Known Allergies   Discussed warning signs or symptoms. Please see discharge instructions. Patient expresses understanding.

## 2018-06-13 LAB — SARS CORONAVIRUS WITH COV-2 RNA, QUAL REAL TIME RT-PCR
Overall Result:: NOT DETECTED
Pan-SARS RNA: NEGATIVE
SARS-COV-2 RNA: NEGATIVE

## 2018-06-14 ENCOUNTER — Encounter: Payer: Self-pay | Admitting: Family Medicine

## 2018-06-14 NOTE — Progress Notes (Signed)
Pt has seen results on MyChart and message also sent for patient to call back if any questions.

## 2018-06-28 ENCOUNTER — Ambulatory Visit: Payer: 59 | Admitting: Physician Assistant

## 2018-09-04 ENCOUNTER — Emergency Department (INDEPENDENT_AMBULATORY_CARE_PROVIDER_SITE_OTHER)
Admission: EM | Admit: 2018-09-04 | Discharge: 2018-09-04 | Disposition: A | Discharge status: Home / Self Care | Payer: 59 | Source: Home / Self Care

## 2018-09-04 ENCOUNTER — Other Ambulatory Visit: Payer: Self-pay

## 2018-09-04 ENCOUNTER — Encounter: Payer: Self-pay | Admitting: Emergency Medicine

## 2018-09-04 DIAGNOSIS — R1084 Generalized abdominal pain: Secondary | ICD-10-CM

## 2018-09-04 DIAGNOSIS — R822 Biliuria: Secondary | ICD-10-CM | POA: Diagnosis not present

## 2018-09-04 DIAGNOSIS — R809 Proteinuria, unspecified: Secondary | ICD-10-CM

## 2018-09-04 DIAGNOSIS — R310 Gross hematuria: Secondary | ICD-10-CM | POA: Diagnosis not present

## 2018-09-04 DIAGNOSIS — R109 Unspecified abdominal pain: Secondary | ICD-10-CM | POA: Diagnosis not present

## 2018-09-04 LAB — COMPLETE METABOLIC PANEL WITH GFR
AG Ratio: 1.4 (calc) (ref 1.0–2.5)
ALT: 61 U/L — ABNORMAL HIGH (ref 9–46)
AST: 19 U/L (ref 10–40)
Albumin: 3.8 g/dL (ref 3.6–5.1)
Alkaline phosphatase (APISO): 92 U/L (ref 36–130)
BUN: 15 mg/dL (ref 7–25)
CO2: 29 mmol/L (ref 20–32)
Calcium: 9.4 mg/dL (ref 8.6–10.3)
Chloride: 106 mmol/L (ref 98–110)
Creat: 1.31 mg/dL (ref 0.60–1.35)
GFR, Est African American: 76 mL/min/{1.73_m2} (ref >=60)
GFR, Est Non African American: 65 mL/min/{1.73_m2} (ref >=60)
Globulin: 2.8 g/dL (calc) (ref 1.9–3.7)
Glucose, Bld: 115 mg/dL — ABNORMAL HIGH (ref 65–99)
Potassium: 4.4 mmol/L (ref 3.5–5.3)
Sodium: 141 mmol/L (ref 135–146)
Total Bilirubin: 0.5 mg/dL (ref 0.2–1.2)
Total Protein: 6.6 g/dL (ref 6.1–8.1)

## 2018-09-04 LAB — POCT CBC W AUTO DIFF (K'VILLE URGENT CARE)

## 2018-09-04 LAB — POCT URINALYSIS DIP (MANUAL ENTRY)
Blood, UA: NEGATIVE
Glucose, UA: NEGATIVE mg/dL
Leukocytes, UA: NEGATIVE
Nitrite, UA: NEGATIVE
Protein Ur, POC: 100 mg/dL — AB
Spec Grav, UA: >=1.03 — AB (ref 1.010–1.025)
Urobilinogen, UA: 0.2 E.U./dL
pH, UA: 6 (ref 5.0–8.0)

## 2018-09-04 NOTE — ED Provider Notes (Signed)
[name]    CSN: [id number] Arrival date & time: 09/04/18  1328     History   Chief Complaint Chief Complaint  Patient presents with  . Flank Pain  . Hematuria    HPI [name] [name] is a 48 y.o. male.   HPI [name] [name] is a 48 y.o. male presenting to UC with c/o Right side flank pain for 3 days with intermittent hematuria the last 2 days along with nausea and generalized abdominal cramping. Denies fever, chills, n/v/d. Denies concern for STIs. Denies hx of UTIs or kidney stones. No sick contacts or recent travel.    Past Medical History:  Diagnosis Date  . Allergy   . Barrett's esophagus determined by biopsy   . Chronic sinusitis   . GERD (gastroesophageal reflux disease)   . Helicobacter pylori gastritis   . Hiatal hernia   . Mixed hyperlipidemia 03/11/2018    Patient Active Problem List   Diagnosis Date Noted  . Decreased calculated GFR 03/29/2018  . Mixed hyperlipidemia 03/11/2018  . Proteinuria 03/10/2018  . Urethral discharge in male, without blood 03/10/2018  . Barrett's esophagus without dysplasia 10/08/2017  . History of Helicobacter pylori infection 10/08/2017  . Chronic rhinitis 10/08/2017  . Bilateral impacted cerumen 10/08/2017  . Acute pain of right shoulder 07/17/2016  . GERD with esophagitis 07/17/2016  . Elevated blood pressure reading 07/17/2016  . Chronic maxillary sinusitis 04/09/2015    Past Surgical History:  Procedure Laterality Date  . SINUS IRRIGATION    . UPPER GI ENDOSCOPY         Home Medications    Prior to Admission medications   Medication Sig Start Date End Date Taking? Authorizing Provider  albuterol (PROVENTIL HFA;VENTOLIN HFA) 108 (90 Base) MCG/ACT inhaler Inhale 2 puffs into the lungs every 6 (six) hours as needed for wheezing or shortness of breath. 06/10/18   Gregor Hams, MD  atorvastatin (LIPITOR) 20 MG tablet Take 1 tablet (20 mg total) by mouth daily. 03/29/18   Trixie Dredge, PA-C   fexofenadine (ALLEGRA) 180 MG tablet Take 1 tablet (180 mg total) by mouth at bedtime. 10/08/17   Trixie Dredge, PA-C  guaiFENesin-codeine [facility]) 100-10 MG/5ML syrup Take 5-10 mLs by mouth 3 (three) times daily as needed for cough. 06/10/18   Gregor Hams, MD  ipratropium (ATROVENT) 0.06 % nasal spray Place 2 sprays into both nostrils 4 (four) times daily. 05/13/18   Emeterio Reeve, DO  omeprazole (PRILOSEC) 20 MG capsule Take 1 capsule (20 mg total) by mouth daily. 03/29/18   Trixie Dredge, PA-C    Family History Family History  Problem Relation Age of Onset  . Hypertension Father   . Throat cancer Father   . Stomach cancer Brother   . Colon cancer Paternal Aunt   . Rectal cancer Neg Hx     Social History Social History   Tobacco Use  . Smoking status: Never Smoker  . Smokeless tobacco: Never Used  Substance Use Topics  . Alcohol use: Yes    Alcohol/week: 14.0 standard drinks    Types: 14 Cans of beer per week  . Drug use: No     Allergies   Patient has no known allergies.   Review of Systems Review of Systems  Constitutional: Negative for chills and fever.  Gastrointestinal: Positive for abdominal pain and nausea. Negative for diarrhea and vomiting.  Genitourinary: Positive for flank pain. Negative for dysuria, frequency and hematuria.  Musculoskeletal: Positive for back  pain. Negative for myalgias.  Neurological: Negative for dizziness, light-headedness and headaches.     Physical Exam Triage Vital Signs ED Triage Vitals  Enc Vitals Group     BP 09/04/18 1418 (!) 155/68     Pulse Rate 09/04/18 1418 84     Resp 09/04/18 1418 16     Temp 09/04/18 1418 97.7 F (36.5 C)     Temp src --      SpO2 09/04/18 1418 97 %     Weight 09/04/18 1420 188 lb (85.3 kg)     Height 09/04/18 1420 5\' 10"  (1.778 m)     Head Circumference --      Peak Flow --      Pain Score 09/04/18 1419 1     Pain Loc --      Pain Edu? --      Excl. in  [place]? --    No data found.  Updated Vital Signs BP (!) 155/68 (BP Location: Right Arm)   Pulse 84   Temp 97.7 F (36.5 C)   Resp 16   Ht 5\' 10"  (1.778 m)   Wt 188 lb (85.3 kg)   SpO2 97%   BMI 26.98 kg/m   Visual Acuity Right Eye Distance:   Left Eye Distance:   Bilateral Distance:    Right Eye Near:   Left Eye Near:    Bilateral Near:     Physical Exam Vitals signs and nursing note reviewed.  Constitutional:      Appearance: Normal appearance. He is well-developed.  HENT:     Head: Normocephalic and atraumatic.     Mouth/Throat:     Mouth: Mucous membranes are moist.  Neck:     Musculoskeletal: Normal range of motion.  Cardiovascular:     Rate and Rhythm: Normal rate and regular rhythm.  Pulmonary:     Effort: Pulmonary effort is normal.     Breath sounds: Normal breath sounds.  Abdominal:     General: Bowel sounds are normal. There is no distension.     Palpations: Abdomen is soft. There is no mass.     Tenderness: There is abdominal tenderness. There is no right CVA tenderness, left CVA tenderness, guarding or rebound.     Hernia: No hernia is present.  Musculoskeletal: Normal range of motion.  Skin:    General: Skin is warm and dry.  Neurological:     Mental Status: He is alert and oriented to person, place, and time.  Psychiatric:        Behavior: Behavior normal.      UC Treatments / Results  Labs (all labs ordered are listed, but only abnormal results are displayed) Labs Reviewed  POCT URINALYSIS DIP (MANUAL ENTRY) - Abnormal; Notable for the following components:      Result Value   Color, UA other (*)    Bilirubin, UA small (*)    Ketones, POC UA moderate (40) (*)    Spec Grav, UA >=1.030 (*)    Protein Ur, POC =100 (*)    All other components within normal limits  URINE CULTURE  COMPLETE METABOLIC PANEL WITH GFR  POCT CBC W AUTO DIFF ([facility])    EKG None  Radiology No results found.  Procedures Procedures (including  critical care time)  Medications Ordered in UC Medications - No data to display  Initial Impression / Assessment and Plan / UC Course  I have reviewed the triage vital signs and the nursing notes.  Pertinent labs & imaging results that were available during my care of the patient were reviewed by me and considered in my medical decision making (see chart for details).     Mild generalized abdominal tenderness Pt c/o Right flank pain. UA: elevated bilirubin and ketones but no definite sign of infection Culture sent Suspect possible kidney stones w/o obstruction as pt still able to urinate w/o difficulty. Strainer provided to pt Encouraged good hydration and f/u with PCP  Discussed symptoms that warrant emergent care in the ED.  Final Clinical Impressions(s) / UC Diagnoses   Final diagnoses:  Right flank pain  Generalized abdominal pain  Gross hematuria  Bilirubin in urine  Urine protein increased     Discharge Instructions      Please be sure to stay well hydrated with clear fluids. Avoid sodas and coffee as these beverages can cause worsening urinary symptoms and dehydration.  You will be notified of the rest of your labs within 2-3 days. Please follow up with family medicine early next week if not improving.  Call 911 or go to the emergency department if symptoms worsening- worsening pain, unable to keep down fluids, vomiting, or other new concerning symptoms develop.    ED Prescriptions    None     Controlled Substance Prescriptions [place] Controlled Substance Registry consulted? Not Applicable   Tyrell Antonio 09/04/18 1548

## 2018-09-04 NOTE — Discharge Instructions (Signed)
°  Please be sure to stay well hydrated with clear fluids. Avoid sodas and coffee as these beverages can cause worsening urinary symptoms and dehydration.  You will be notified of the rest of your labs within 2-3 days. Please follow up with family medicine early next week if not improving.  Call 911 or go to the emergency department if symptoms worsening- worsening pain, unable to keep down fluids, vomiting, or other new concerning symptoms develop.

## 2018-09-04 NOTE — ED Triage Notes (Signed)
Patient reports flank pain past 3 days intermittently and hematuria for 2 days.He has not travelled.

## 2018-09-05 ENCOUNTER — Telehealth: Payer: Self-pay | Admitting: Emergency Medicine

## 2018-09-05 LAB — URINE CULTURE
MICRO NUMBER:: 568287
Result:: NO GROWTH
SPECIMEN QUALITY:: ADEQUATE

## 2018-09-05 NOTE — Telephone Encounter (Signed)
Told patient BMP wnl except elevated ALT; he has been advised to follow with Maisie Fus by making an appt tomorrow.

## 2018-09-06 ENCOUNTER — Telehealth: Payer: Self-pay | Admitting: Emergency Medicine

## 2018-09-06 NOTE — Telephone Encounter (Signed)
Urine culture showed no growth, he will follow up with Evlyn Clines next week.

## 2018-09-13 ENCOUNTER — Encounter: Payer: Self-pay | Admitting: Physician Assistant

## 2018-09-13 ENCOUNTER — Ambulatory Visit (INDEPENDENT_AMBULATORY_CARE_PROVIDER_SITE_OTHER): Payer: 59 | Admitting: Physician Assistant

## 2018-09-13 VITALS — BP 145/88 | HR 59 | Temp 97.5°F | Wt 197.0 lb

## 2018-09-13 DIAGNOSIS — R822 Biliuria: Secondary | ICD-10-CM | POA: Diagnosis not present

## 2018-09-13 DIAGNOSIS — K2100 Gastro-esophageal reflux disease with esophagitis, without bleeding: Secondary | ICD-10-CM

## 2018-09-13 DIAGNOSIS — R740 Nonspecific elevation of levels of transaminase and lactic acid dehydrogenase [LDH]: Secondary | ICD-10-CM | POA: Diagnosis not present

## 2018-09-13 DIAGNOSIS — R944 Abnormal results of kidney function studies: Secondary | ICD-10-CM

## 2018-09-13 DIAGNOSIS — R10812 Left upper quadrant abdominal tenderness: Secondary | ICD-10-CM | POA: Diagnosis not present

## 2018-09-13 DIAGNOSIS — R140 Abdominal distension (gaseous): Secondary | ICD-10-CM | POA: Diagnosis not present

## 2018-09-13 DIAGNOSIS — R824 Acetonuria: Secondary | ICD-10-CM | POA: Diagnosis not present

## 2018-09-13 DIAGNOSIS — R7401 Elevation of levels of liver transaminase levels: Secondary | ICD-10-CM

## 2018-09-13 DIAGNOSIS — N289 Disorder of kidney and ureter, unspecified: Secondary | ICD-10-CM

## 2018-09-13 DIAGNOSIS — Z8719 Personal history of other diseases of the digestive system: Secondary | ICD-10-CM

## 2018-09-13 LAB — POCT URINALYSIS DIPSTICK
Bilirubin, UA: NEGATIVE
Blood, UA: NEGATIVE
Glucose, UA: NEGATIVE
Ketones, UA: NEGATIVE
Leukocytes, UA: NEGATIVE
Nitrite, UA: NEGATIVE
Protein, UA: NEGATIVE
Spec Grav, UA: 1.015 (ref 1.010–1.025)
Urobilinogen, UA: 1 E.U./dL
pH, UA: 8.5 — AB (ref 5.0–8.0)

## 2018-09-13 MED ORDER — OMEPRAZOLE 40 MG PO CPDR: 40.0000 mg | DELAYED_RELEASE_CAPSULE | Freq: Every day | ORAL | 0 refills | Status: DC

## 2018-09-13 NOTE — Progress Notes (Signed)
HPI:                                                                [name] [name] is a 48 y.o. male who presents to [facility] [name] today for urgent care follow-up  Patient with PMH GERD, hx of H. pylori gastritis (10/2015) was evaluated in urgent care on 09-04-18 for right flank pain and hematuria. UA was positive for bilirubin and ketonuria.  Patient was provided with a strainer to look for kidney stone in the urine. Urine culture was negative.  Metabolic panel showed mildly reduced GFR of 65, normal serum creatinine 1.31, stable compared to prior.  ALT elevated at 61. CBC result is illegible, but no abnormality was noted by the urgent care provider. He denies any additional flank pain or hematuria since that time.  His main concern today is abdominal bloating and nausea. Endorses intermittent bilateral upper abdominal discomfort, post-prandial nausea, belching with regurgitation for several months. Reports after eating/drinking he becomes bloated and stomach feels firm. Symptoms are worse with drinking water on an empty stomach and eating bread. He notes 1-2 BM's per day, that are sometimes light tan and streaked with blood. He also states for years his urine has appeared rusty colored.  Drinks alcohol socially. On average he has 2 beers and/or 2-4 shots of liquor approx 2-3 times per week. Denies daily alcohol consumption or binge drinking.   Denies use of NSAIDs or OTC medications.  Taking Omeprazole 20 mg daily.  Denies unintentional weight loss, constitutional symptoms, pruritus, jaundice/icterus. Denies peripheral edema.  Prior work-up 04/10/18 - US Renal - unremarkable 11/10/17 - EGD w/ biopsies - gastritis, negative H. Pylori and esophagitis; no evidence of Barrett's  Past Medical History:  Diagnosis Date  . Allergy   . Barrett's esophagus determined by biopsy   . Chronic sinusitis   . GERD (gastroesophageal reflux disease)   .  Helicobacter pylori gastritis   . Hiatal hernia   . Mixed hyperlipidemia 03/11/2018   Past Surgical History:  Procedure Laterality Date  . SINUS IRRIGATION    . UPPER GI ENDOSCOPY     Social History   Tobacco Use  . Smoking status: Never Smoker  . Smokeless tobacco: Never Used  Substance Use Topics  . Alcohol use: Yes    Alcohol/week: 14.0 standard drinks    Types: 14 Cans of beer per week   family history includes Colon cancer in his paternal aunt; Hypertension in his father; Stomach cancer in his brother; Throat cancer in his father.    ROS: negative except as noted in the HPI  Medications: Current Outpatient Medications  Medication Sig Dispense Refill  . albuterol (PROVENTIL HFA;VENTOLIN HFA) 108 (90 Base) MCG/ACT inhaler Inhale 2 puffs into the lungs every 6 (six) hours as needed for wheezing or shortness of breath. 1 Inhaler 0  . fexofenadine (ALLEGRA) 180 MG tablet Take 1 tablet (180 mg total) by mouth at bedtime. 90 tablet 3  . ipratropium (ATROVENT) 0.06 % nasal spray Place 2 sprays into both nostrils 4 (four) times daily. 15 mL 1  . omeprazole (PRILOSEC) 40 MG capsule Take 1 capsule (40 mg total) by mouth daily for 28 days. 28 capsule 0  . atorvastatin (LIPITOR) 20 MG tablet  Take 1 tablet (20 mg total) by mouth daily. (Patient not taking: Reported on 09/13/2018) 90 tablet 3   No current facility-administered medications for this visit.    No Known Allergies     Objective:  BP (!) 145/88   Pulse (!) 59   Temp (!) 97.5 F (36.4 C) (Oral)   Wt 197 lb (89.4 kg)   BMI 28.27 kg/m  Gen:  alert, not ill-appearing, no distress, appropriate for age, overweight male HEENT: head normocephalic without obvious abnormality, conjunctiva and cornea clear, trachea midline Pulm: Normal work of breathing, normal phonation, clear to auscultation bilaterally, no wheezes, rales or rhonchi CV: Normal rate, regular rhythm, s1 and s2 distinct, no murmurs, clicks or rubs  GI:  abdomen soft, epigastric and LUQ tenderness without guarding or rigidity, negative Murphy's sign, no CVA tenderness Neuro: alert and oriented x 3, no tremor MSK: extremities atraumatic, normal gait and station, no peripheral edema Skin: intact, no rashes on exposed skin, no jaundice, no cyanosis  Recent Results (from the past 2160 hour(s))  POCT urinalysis dipstick (new)     Status: Abnormal   Collection Time: 09/04/18  2:48 PM  Result Value Ref Range   Color, UA other (A) yellow   Clarity, UA clear clear   Glucose, UA negative negative mg/dL   Bilirubin, UA small (A) negative   Ketones, POC UA moderate (40) (A) negative mg/dL   Spec Grav, UA >=1.030 (A) 1.010 - 1.025   Blood, UA negative negative   pH, UA 6.0 5.0 - 8.0   Protein Ur, POC =100 (A) negative mg/dL   Urobilinogen, UA 0.2 0.2 or 1.0 E.U./dL   Nitrite, UA Negative Negative   Leukocytes, UA Negative Negative  POCT CBC w auto diff     Status: None   Collection Time: 09/04/18  2:48 PM  Result Value Ref Range   WBC      Comment: see scanned results   Lymphocytes relative %     Monocytes relative %     Neutrophils relative % (GR)     Lymphocytes absolute     Monocyes absolute     Neutrophils absolute (GR#)     RBC     Hemoglobin     Hematocrit     MCV     MCH     MCHC     RDW     Platelet count     MPV    COMPLETE METABOLIC PANEL WITH GFR     Status: Abnormal   Collection Time: 09/04/18  3:01 PM  Result Value Ref Range   Glucose, Bld 115 (H) 65 - 99 mg/dL    Comment: .            Fasting reference interval . For someone without known diabetes, a glucose value between 100 and 125 mg/dL is consistent with prediabetes and should be confirmed with a follow-up test. .    BUN 15 7 - 25 mg/dL   Creat 1.31 0.60 - 1.35 mg/dL   GFR, Est Non African American 65 > OR = 60 mL/min/1.14m   GFR, Est African American 76 > OR = 60 mL/min/1.735m  BUN/Creatinine Ratio NOT APPLICABLE 6 - 22 (calc)   Sodium 141 135 - 146  mmol/L   Potassium 4.4 3.5 - 5.3 mmol/L   Chloride 106 98 - 110 mmol/L   CO2 29 20 - 32 mmol/L   Calcium 9.4 8.6 - 10.3 mg/dL   Total Protein 6.6 6.1 - 8.1  g/dL   Albumin 3.8 3.6 - 5.1 g/dL   Globulin 2.8 1.9 - 3.7 g/dL (calc)   AG Ratio 1.4 1.0 - 2.5 (calc)   Total Bilirubin 0.5 0.2 - 1.2 mg/dL   Alkaline phosphatase (APISO) 92 36 - 130 U/L   AST 19 10 - 40 U/L   ALT 61 (H) 9 - 46 U/L  Urine culture     Status: None   Collection Time: 09/04/18  3:01 PM   Specimen: Vein; Urine  Result Value Ref Range   MICRO NUMBER: [id number]    SPECIMEN QUALITY: Adequate    Sample Source URINE    STATUS: FINAL    Result: No Growth    Study Result  CLINICAL DATA:  Flank pain and proteinuria  EXAM: RENAL / URINARY TRACT ULTRASOUND COMPLETE  COMPARISON:  None.  FINDINGS: Right Kidney:  Renal measurements: 11.0 x 5.2 x 4.7 cm = volume: 138 mL . Echogenicity and renal cortical thickness are within normal limits. No mass, perinephric fluid, or hydronephrosis visualized. No sonographically demonstrable calculus or ureterectasis.  Left Kidney:  Renal measurements: 10.7 x 5.0 x 5.0 cm = volume: 142 mL. Echogenicity and renal cortical thickness are within normal limits. No mass, perinephric fluid, or hydronephrosis visualized. No sonographically demonstrable calculus or ureterectasis.  Bladder:  Appears normal for degree of bladder distention.  IMPRESSION: Study within normal limits.   Electronically Signed   By: Lowella Grip III M.D.   On: 04/10/2018 10:34     No results found for this or any previous visit (from the past 72 hour(s)). No results found.    Assessment and Plan: 48 y.o. male with   .Nylan was seen today for follow-up.  Diagnoses and all orders for this visit:  Left upper quadrant abdominal tenderness without rebound tenderness -     Ambulatory referral to Gastroenterology -     Hepatic function panel -     CBC with  Differential/Platelet -     Sedimentation rate -     C-reactive protein -     Lipase -     US Abdomen Complete  GERD with esophagitis -     Ambulatory referral to Gastroenterology -     omeprazole (PRILOSEC) 40 MG capsule; Take 1 capsule (40 mg total) by mouth daily for 28 days.  Abdominal distension -     Ambulatory referral to Gastroenterology -     Hepatic function panel -     CBC with Differential/Platelet -     Sedimentation rate -     C-reactive protein -     Lipase -     US Abdomen Complete  Elevated ALT measurement -     Ambulatory referral to Gastroenterology -     Hepatic function panel -     CBC with Differential/Platelet -     Sedimentation rate -     C-reactive protein -     Lipase -     US Abdomen Complete  Decreased GFR  Renal insufficiency  Bilirubinuria -     POCT Urinalysis Dipstick -     Urine Microscopic; Future -     US Abdomen Complete  Ketonuria -     POCT Urinalysis Dipstick -     Urine Microscopic; Future    Afebrile, no tachypnea, no tachycardia, mild LUQ and epigastric tenderness w/o guarding on exam POC UA negative for bilirubin/ketones today Liver function, CBC w diff, ESR/CRP, lipase and US Abdomen pending Recommend follow-up with GI  due to underlying esophagitis/gastritis and recent hematochezia Counseled on low-fat NASH diet. Abstain from alcohol Increasing Omeprazole to 40 mg QD x 4 weeks  Patient education and anticipatory guidance given Patient agrees with treatment plan Follow-up as needed if symptoms worsen or fail to improve  Darlyne Russian PA-C

## 2018-09-13 NOTE — Patient Instructions (Signed)
Nonalcoholic Fatty Liver Disease Diet Nonalcoholic fatty liver disease is a condition that causes fat to accumulate in and around the liver. The disease makes it harder for the liver to work the way that it should. Following a healthy diet can help to keep nonalcoholic fatty liver disease under control. It can also help to prevent or improve conditions that are associated with the disease, such as heart disease, diabetes, high blood pressure, and abnormal cholesterol levels. Along with regular exercise, this diet:  Promotes weight loss.  Helps to control blood sugar levels.  Helps to improve the way that the body uses insulin. What do I need to know about this diet?  Use the glycemic index (GI) to plan your meals. The index tells you how quickly a food will raise your blood sugar. Choose low-GI foods. These foods take a longer time to raise blood sugar.  Keep track of how many calories you take in. Eating the right amount of calories will help you to achieve a healthy weight.  You may want to follow a Mediterranean diet. This diet includes a lot of vegetables, lean meats or fish, whole grains, fruits, and healthy oils and fats. What foods can I eat? Grains Whole grains, such as whole-wheat or whole-grain breads, crackers, tortillas, cereals, and pasta. Stone-ground whole wheat. Pumpernickel bread. Unsweetened oatmeal. Bulgur. Barley. Quinoa. Brown or wild rice. Corn or whole-wheat flour tortillas. Vegetables Lettuce. Spinach. Peas. Beets. Cauliflower. Cabbage. Broccoli. Carrots. Tomatoes. Squash. Eggplant. Herbs. Peppers. Onions. Cucumbers. Brussels sprouts. Yams and sweet potatoes. Beans. Lentils. Fruits Bananas. Apples. Oranges. Grapes. Papaya. Mango. Pomegranate. Kiwi. Grapefruit. Cherries. Meats and Other Protein Sources Seafood and shellfish. Lean meats. Poultry. Tofu. Dairy Low-fat or fat-free dairy products, such as yogurt, cottage cheese, and cheese. Beverages Water. Sugar-free  drinks. Tea. Coffee. Low-fat or skim milk. Milk alternatives, such as soy or almond milk. Real fruit juice. Condiments Mustard. Relish. Low-fat, low-sugar ketchup and barbecue sauce. Low-fat or fat-free mayonnaise. Sweets and Desserts Sugar-free sweets. Fats and Oils Avocado. Canola or olive oil. Nuts and nut butters. Seeds. The items listed above may not be a complete list of recommended foods or beverages. Contact your dietitian for more options. What foods are not recommended? Palm oil and coconut oil. Processed foods. Fried foods. Sweetened drinks, such as sweet tea, milkshakes, snow cones, iced sweet drinks, and sodas. Alcohol. Sweets. Foods that contain a lot of salt or sodium. The items listed above may not be a complete list of foods and beverages to avoid. Contact your dietitian for more information. This information is not intended to replace advice given to you by your health care provider. Make sure you discuss any questions you have with your health care provider. Document Released: 07/25/2014 Document Revised: 08/16/2015 Document Reviewed: 04/04/2014 Elsevier Interactive Patient Education  2019 [place] for Gastroesophageal Reflux Disease, Adult When you have gastroesophageal reflux disease (GERD), the foods you eat and your eating habits are very important. Choosing the right foods can help ease the discomfort of GERD. Consider working with a diet and nutrition specialist (dietitian) to help you make healthy food choices. What general guidelines should I follow?  Eating plan  Choose healthy foods low in fat, such as fruits, vegetables, whole grains, low-fat dairy products, and lean meat, fish, and poultry.  Eat frequent, small meals instead of three large meals each day. Eat your meals slowly, in a relaxed setting. Avoid bending over or lying down until 2-3 hours after eating.  Limit  high-fat foods such as fatty meats or fried foods.  Limit your intake  of oils, butter, and shortening to less than 8 teaspoons each day.  Avoid the following: ? Foods that cause symptoms. These may be different for different people. Keep a food diary to keep track of foods that cause symptoms. ? Alcohol. ? Drinking large amounts of liquid with meals. ? Eating meals during the 2-3 hours before bed.  Cook foods using methods other than frying. This may include baking, grilling, or broiling. Lifestyle  Maintain a healthy weight. Ask your health care provider what weight is healthy for you. If you need to lose weight, work with your health care provider to do so safely.  Exercise for at least 30 minutes on 5 or more days each week, or as told by your health care provider.  Avoid wearing clothes that fit tightly around your waist and chest.  Do not use any products that contain nicotine or tobacco, such as cigarettes and e-cigarettes. If you need help quitting, ask your health care provider.  Sleep with the head of your bed raised. Use a wedge under the mattress or blocks under the bed frame to raise the head of the bed. What foods are not recommended? The items listed may not be a complete list. Talk with your dietitian about what dietary choices are best for you. Grains Pastries or quick breads with added fat. [place] toast. Vegetables Deep fried vegetables. [place] fries. Any vegetables prepared with added fat. Any vegetables that cause symptoms. For some people this may include tomatoes and tomato products, chili peppers, onions and garlic, and horseradish. Fruits Any fruits prepared with added fat. Any fruits that cause symptoms. For some people this may include citrus fruits, such as oranges, grapefruit, pineapple, and lemons. Meats and other protein foods High-fat meats, such as fatty beef or pork, hot dogs, ribs, ham, sausage, salami and bacon. Fried meat or protein, including fried fish and fried chicken. Nuts and nut butters. Dairy Whole milk and  chocolate milk. Sour cream. Cream. Ice cream. Cream cheese. Milk shakes. Beverages Coffee and tea, with or without caffeine. Carbonated beverages. Sodas. Energy drinks. Fruit juice made with acidic fruits (such as orange or grapefruit). Tomato juice. Alcoholic drinks. Fats and oils Butter. Margarine. Shortening. Ghee. Sweets and desserts Chocolate and cocoa. Donuts. Seasoning and other foods Pepper. Peppermint and spearmint. Any condiments, herbs, or seasonings that cause symptoms. For some people, this may include curry, hot sauce, or vinegar-based salad dressings. Summary  When you have gastroesophageal reflux disease (GERD), food and lifestyle choices are very important to help ease the discomfort of GERD.  Eat frequent, small meals instead of three large meals each day. Eat your meals slowly, in a relaxed setting. Avoid bending over or lying down until 2-3 hours after eating.  Limit high-fat foods such as fatty meat or fried foods. This information is not intended to replace advice given to you by your health care provider. Make sure you discuss any questions you have with your health care provider. Document Released: 03/10/2005 Document Revised: 03/11/2016 Document Reviewed: 03/11/2016 Elsevier Interactive Patient Education  2019 [employer].

## 2018-09-17 ENCOUNTER — Telehealth (INDEPENDENT_AMBULATORY_CARE_PROVIDER_SITE_OTHER): Payer: 59 | Admitting: Gastroenterology

## 2018-09-17 ENCOUNTER — Other Ambulatory Visit: Payer: Self-pay

## 2018-09-17 ENCOUNTER — Ambulatory Visit (INDEPENDENT_AMBULATORY_CARE_PROVIDER_SITE_OTHER): Payer: 59

## 2018-09-17 ENCOUNTER — Encounter: Payer: Self-pay | Admitting: Gastroenterology

## 2018-09-17 VITALS — Ht 70.0 in | Wt 197.0 lb

## 2018-09-17 DIAGNOSIS — R110 Nausea: Secondary | ICD-10-CM

## 2018-09-17 DIAGNOSIS — K2100 Gastro-esophageal reflux disease with esophagitis, without bleeding: Secondary | ICD-10-CM

## 2018-09-17 DIAGNOSIS — K921 Melena: Secondary | ICD-10-CM

## 2018-09-17 DIAGNOSIS — R822 Biliuria: Secondary | ICD-10-CM

## 2018-09-17 DIAGNOSIS — R740 Nonspecific elevation of levels of transaminase and lactic acid dehydrogenase [LDH]: Secondary | ICD-10-CM | POA: Diagnosis not present

## 2018-09-17 DIAGNOSIS — R140 Abdominal distension (gaseous): Secondary | ICD-10-CM

## 2018-09-17 DIAGNOSIS — R10812 Left upper quadrant abdominal tenderness: Secondary | ICD-10-CM | POA: Diagnosis not present

## 2018-09-17 DIAGNOSIS — R7401 Elevation of levels of liver transaminase levels: Secondary | ICD-10-CM

## 2018-09-17 MED ORDER — RIFAXIMIN 550 MG PO TABS: 550.0000 mg | ORAL_TABLET | Freq: Three times a day (TID) | ORAL | 1 refills | Status: DC

## 2018-09-17 MED ORDER — CLENPIQ 10-3.5-12 MG-GM -GM/160ML PO SOLN: 1.0000 | ORAL | 0 refills | Status: DC

## 2018-09-17 NOTE — Patient Instructions (Signed)
If you are age 46 or older, your body mass index should be between 23-30. Your Body mass index is 28.27 kg/m. If this is out of the aforementioned range listed, please consider follow up with your Primary Care Provider.  If you are age 66 or younger, your body mass index should be between 19-25. Your Body mass index is 28.27 kg/m. If this is out of the aformentioned range listed, please consider follow up with your Primary Care Provider.   To help prevent the possible spread of infection to our patients, communities, and staff; we will be implementing the following measures:  As of now we are not allowing any visitors/family members to accompany you to any upcoming appointments with [facility]. If you have any concerns about this please contact our office to discuss prior to the appointment.   You have been scheduled for a colonoscopy. Please follow written instructions given to you at your visit today.  Please pick up your prep supplies at the pharmacy within the next 1-3 days. If you use inhalers (even only as needed), please bring them with you on the day of your procedure. Your physician has requested that you go to www.startemmi.com and enter the access code given to you at your visit today. This web site gives a general overview about your procedure. However, you should still follow specific instructions given to you by our office regarding your preparation for the procedure.  We have sent the following medications to your pharmacy for you to pick up at your convenience: Rifaximin 550mg  three times daily for 2 weeks. Clenpiq  Please purchase the following medications over the counter and take as directed: Align (probiotic)  Please call our office at [phone] to set up your 3 month follow up visit.  It was a pleasure to see you today!  Vito Cirigliano, D.O.

## 2018-09-17 NOTE — Progress Notes (Signed)
Chief Complaint: abdominal bloating, nausea, upper abdominal pain  Referring Provider:     Trixie Dredge, PA-C   HPI:    Due to current restrictions/limitations of in-office visits due to the COVID-19 pandemic, this scheduled clinical appointment was converted to a telehealth virtual consultation using D[employer].  -Time of medical discussion: 19 minutes -The patient did consent to this virtual visit and is aware of possible charges through their insurance for this visit.  -Names of all parties present: [name] [name] (patient), Gerrit Heck, DO, [facility] (physician) -Patient location: Home -Physician location: Office  [name] [name] is a 48 y.o. male referred back to the Gastroenterology Clinic for evaluation of nausea, abdominal bloating, and upper abdominal discomfort.  Today, he states continues to have post prandial bloating and upper abdominal discomfort along with nausea w/o emesis. Independent of food types. Some improvement with Pepto (nausea, but not bloating).  Separately, he has seen streaks of BRB mixed in stool, which is new over last few months.   Was evaluated in UC for this issue on 09/04/2018.  UA positive for bilirubin and ketonuria, creatinine 1.3 (stable from prior), and ALT 61 (previously 27), otherwise normal CMP and CBC per review of notes.  Was then seen by his PCM on 6/22 with ongoing abdominal bloating and nausea along with bilateral upper abdominal discomfort.  PCM ordered CBC, hepatic panel, ESR, CRP, lipase-not yet done. Completed US this AM- not yet read.   Had similar symptoms of epigastric pain and bloating in 10/2017 and in 2017, treated with ABX for H. pylori in 2017 with resolution, then recurrence in 2019.  He was previously seen by me in 10/2017 with GERD and reported history of Barrett's Esophagus.  Has a long history of GERD with index sxs of HB, regurgitation, and dyspepsia. Sxs overall controlled with omeprazole 20 mg daily,  pre-breakfast. Occasaional breakthrough sxs. Exacrbated by spicy, red sauces, and bananas. No associated dysphagia, odynophagia.    Endoscopic history: - EGD (10/2016, Dr. Bryan Lemma): 1 cm of salmon-colored mucosa (biopsy: Inflammation without Barrett's), Hill grade 2 valve, non-H. pylori gastritis, normal duodenum with normal biopsies  - EGD (2017, [place]): H. pylori gastritis, Barrett's esophagus (only path report available for previous review, so unclear extent of PE), HH (unclear size)  Past medical history, past surgical history, social history, family history, medications, and allergies reviewed in the chart and with patient.    Past Medical History:  Diagnosis Date  . Allergy   . Barrett's esophagus determined by biopsy   . Chronic sinusitis   . GERD (gastroesophageal reflux disease)   . Helicobacter pylori gastritis   . Hiatal hernia   . Mixed hyperlipidemia 03/11/2018     Past Surgical History:  Procedure Laterality Date  . SINUS IRRIGATION    . UPPER GI ENDOSCOPY     Family History  Problem Relation Age of Onset  . Hypertension Father   . Throat cancer Father   . Stomach cancer Brother   . Colon cancer Paternal Aunt   . Rectal cancer Neg Hx    Social History   Tobacco Use  . Smoking status: Never Smoker  . Smokeless tobacco: Never Used  Substance Use Topics  . Alcohol use: Yes    Alcohol/week: 14.0 standard drinks    Types: 14 Cans of beer per week  . Drug use: No   Current Outpatient Medications  Medication Sig Dispense Refill  . albuterol (PROVENTIL HFA;VENTOLIN  HFA) 108 (90 Base) MCG/ACT inhaler Inhale 2 puffs into the lungs every 6 (six) hours as needed for wheezing or shortness of breath. 1 Inhaler 0  . fexofenadine (ALLEGRA) 180 MG tablet Take 1 tablet (180 mg total) by mouth at bedtime. 90 tablet 3  . ipratropium (ATROVENT) 0.06 % nasal spray Place 2 sprays into both nostrils 4 (four) times daily. 15 mL 1  . omeprazole (PRILOSEC) 40 MG capsule  Take 1 capsule (40 mg total) by mouth daily for 28 days. 28 capsule 0   No current facility-administered medications for this visit.    No Known Allergies   Review of Systems: All systems reviewed and negative except where noted in HPI.     Physical Exam:    Complete physical exam not completed due to the nature of this telehealth communication.   Gen: Awake, alert, and oriented, and well communicative. HEENT: EOMI, non-icteric sclera, NCAT, MMM Neck: Normal movement of head and neck Pulm: No labored breathing, speaking in full sentences without conversational dyspnea Derm: No apparent lesions or bruising in visible field MS: Moves all visible extremities without noticeable abnormality Psych: Pleasant, cooperative, normal speech, thought processing seemingly intact   ASSESSMENT AND PLAN;   1) Abdominal bloating 2) Nausea without vomiting  Previous work-up largely unrevealing aside from mild non-H. pylori gastritis.  Duodenum normal with normal duodenal biopsies.  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  Discussed breath testing versus empirically starting rifaximin/probiotic, and he elected for the latter.  Based on current guidelines, feel this is a very reasonable approach.  -Start rifaximin 550 mg p.o. 3 times daily x14 days, RF 1 -Start probiotic, such as Electronics engineer daily -If medication is cost prohibitive, will proceed with breath testing  3) Hematochezia -Colonoscopy to evaluate for mucosal/luminal etiology - Recent CBC otherwise without anemia  4) GERD 5) ?Barrett's Esophagus -Well-controlled on current PPI therapy - Has a reported history of Barrett's Esophagus in 2017, but most recent EGD in 10/2017 without intestinal metaplasia on biopsies.  Given equivocal findings but no high risk features, recommendation for repeat EGD in approximately 3 years  6) Elevated ALT: -Isolated elevation in ALT at 61, which was previously normal 27 -Follow-up on ultrasound  performed earlier today -Repeat LAE's in 3 to 6 months  The indications, risks, and benefits of colonoscopy were explained to the patient in detail. Risks include but are not limited to bleeding, perforation, adverse reaction to medications, and cardiopulmonary compromise. Sequelae include but are not limited to the possibility of surgery, hospitalization, and mortality. The patient verbalized understanding and wished to proceed. All questions answered, referred to the scheduler and bowel prep ordered. Further recommendations pending results of the exam.     Lavena Bullion, DO, FACG  09/17/2018, 1:23 PM   Ottis Stain*

## 2018-09-21 ENCOUNTER — Encounter: Payer: Self-pay | Admitting: Physician Assistant

## 2018-09-21 ENCOUNTER — Other Ambulatory Visit: Payer: Self-pay

## 2018-09-21 DIAGNOSIS — K824 Cholesterolosis of gallbladder: Secondary | ICD-10-CM

## 2018-09-21 HISTORY — DX: Cholesterolosis of gallbladder: K82.4

## 2018-09-21 MED ORDER — RIFAXIMIN 550 MG PO TABS: 550.0000 mg | ORAL_TABLET | Freq: Three times a day (TID) | ORAL | 1 refills | Status: DC

## 2018-09-21 MED ORDER — CLENPIQ 10-3.5-12 MG-GM -GM/160ML PO SOLN: 1.0000 | ORAL | 0 refills | Status: DC

## 2018-09-30 ENCOUNTER — Telehealth: Payer: Self-pay | Admitting: Gastroenterology

## 2018-09-30 NOTE — Telephone Encounter (Signed)
[phone] joel cover my meds called  Would like the statues for the pre Auth for XIFAXAN  Refs: [id number]

## 2018-10-04 ENCOUNTER — Telehealth: Payer: Self-pay | Admitting: Gastroenterology

## 2018-10-04 NOTE — Telephone Encounter (Deleted)
Hi [name] [name], we received a referral from pt's PCP for adbominal distension, GERD and elevated ALT. Pt had appt. Recently on 6/26 for the same sxs. Per Dr. Vivia Ewing notes pt needs f/u in 3 months so I am not sure if he wants to see him before. Please advise for scheduling. Thank you.

## 2018-10-04 NOTE — Telephone Encounter (Signed)
PA for denied to Xifaxan... What would you like to try next? I have placed denial letter on your desk.

## 2018-10-10 ENCOUNTER — Other Ambulatory Visit: Payer: Self-pay | Admitting: Physician Assistant

## 2018-10-10 DIAGNOSIS — K2100 Gastro-esophageal reflux disease with esophagitis, without bleeding: Secondary | ICD-10-CM

## 2018-10-12 NOTE — Telephone Encounter (Signed)
I spoke with PA today.  The form that he uses outdated (2016) even though the form on their own website is updated from 2019 and includes treatment with Xifaxan for SIBO.  Submitted correct ICD 10 (A04.9) and rationale for use.  Optimal treatment is with rifaximin 550 mg p.o. tid x14 days, RF 1.  If the again declined the rifaximin, we can plan to use trimethoprim sulfamethoxazole 160 mg / 800 mg bid x14 days, RF 1.  This has a published 95% efficacy rate by recently published guidelines.  Would plan to use in conjunction with probiotics.  No allergies noted in his chart.  Will standby for response from [facility].  Thanks.

## 2018-10-14 ENCOUNTER — Telehealth: Payer: Self-pay | Admitting: Gastroenterology

## 2018-10-14 NOTE — Telephone Encounter (Signed)
Pt rescheduled his procedure from tomorrow until 10/27/2018 because of a conflict with work schedule.

## 2018-10-15 ENCOUNTER — Encounter: Payer: 59 | Admitting: Gastroenterology

## 2018-10-15 NOTE — Telephone Encounter (Signed)
[facility] once again denied coverage for Xifaxan. Patient is coming to the [facility] office today to pick up samples of the medication Rifaximin 550mg  by mouth three times a day for 14 days. Patient will call us back with questions or concerns.

## 2018-10-16 ENCOUNTER — Other Ambulatory Visit: Payer: Self-pay | Admitting: Physician Assistant

## 2018-10-22 ENCOUNTER — Encounter: Payer: Self-pay | Admitting: Gastroenterology

## 2018-10-26 ENCOUNTER — Telehealth: Payer: Self-pay | Admitting: Gastroenterology

## 2018-10-26 NOTE — Telephone Encounter (Signed)
Spoke with patient regarding Covid-19 screening questions °Covid-19 Screening Questions: ° °Do you now or have you had a fever in the last 14 days? No    °Do you have any respiratory symptoms of shortness of breath or cough now or in the last 14 days? no ° °Do you have any family members or close contacts with diagnosed or suspected Covid-19 in the past 14 days? no ° °Have you been tested for Covid-19 and found to be positive? no ° °Pt made aware of that care partner may wait in the car or come up to the lobby during the procedure but will need to provide their own mask. °

## 2018-10-27 ENCOUNTER — Encounter: Payer: 59 | Admitting: Gastroenterology

## 2018-11-09 ENCOUNTER — Other Ambulatory Visit: Payer: Self-pay | Admitting: Physician Assistant

## 2018-11-09 DIAGNOSIS — K2100 Gastro-esophageal reflux disease with esophagitis, without bleeding: Secondary | ICD-10-CM

## 2018-11-09 NOTE — Telephone Encounter (Signed)
Needs appointment

## 2018-11-12 ENCOUNTER — Telehealth: Payer: Self-pay | Admitting: Gastroenterology

## 2018-11-12 NOTE — Telephone Encounter (Signed)

## 2018-11-15 ENCOUNTER — Other Ambulatory Visit: Payer: Self-pay

## 2018-11-15 ENCOUNTER — Ambulatory Visit (AMBULATORY_SURGERY_CENTER): Payer: 59 | Admitting: Gastroenterology

## 2018-11-15 ENCOUNTER — Encounter: Payer: Self-pay | Admitting: Gastroenterology

## 2018-11-15 VITALS — BP 129/73 | HR 62 | Temp 98.6°F | Resp 18 | Ht 70.0 in | Wt 197.0 lb

## 2018-11-15 DIAGNOSIS — D122 Benign neoplasm of ascending colon: Secondary | ICD-10-CM | POA: Diagnosis not present

## 2018-11-15 DIAGNOSIS — K5730 Diverticulosis of large intestine without perforation or abscess without bleeding: Secondary | ICD-10-CM | POA: Diagnosis not present

## 2018-11-15 DIAGNOSIS — K635 Polyp of colon: Secondary | ICD-10-CM

## 2018-11-15 DIAGNOSIS — D124 Benign neoplasm of descending colon: Secondary | ICD-10-CM | POA: Diagnosis not present

## 2018-11-15 DIAGNOSIS — K640 First degree hemorrhoids: Secondary | ICD-10-CM | POA: Diagnosis not present

## 2018-11-15 DIAGNOSIS — R140 Abdominal distension (gaseous): Secondary | ICD-10-CM

## 2018-11-15 DIAGNOSIS — D125 Benign neoplasm of sigmoid colon: Secondary | ICD-10-CM | POA: Diagnosis not present

## 2018-11-15 MED ORDER — SODIUM CHLORIDE 0.9 % IV SOLN: 500.0000 mL | Freq: Once | INTRAVENOUS | Status: DC

## 2018-11-15 NOTE — Progress Notes (Signed)
JB temp CW vitals I have reviewed the patient's medical history in detail and updated the computerized patient record.  No egg or soy allergy

## 2018-11-15 NOTE — Patient Instructions (Signed)
Handouts given for hemorrhoids, hemorrhoid banding, polyps and diverticulosis.  YOU HAD AN ENDOSCOPIC PROCEDURE TODAY AT [facility]:   Refer to the procedure report that was given to you for any specific questions about what was found during the examination.  If the procedure report does not answer your questions, please call your gastroenterologist to clarify.  If you requested that your care partner not be given the details of your procedure findings, then the procedure report has been included in a sealed envelope for you to review at your convenience later.  YOU SHOULD EXPECT: Some feelings of bloating in the abdomen. Passage of more gas than usual.  Walking can help get rid of the air that was put into your GI tract during the procedure and reduce the bloating. If you had a lower endoscopy (such as a colonoscopy or flexible sigmoidoscopy) you may notice spotting of blood in your stool or on the toilet paper. If you underwent a bowel prep for your procedure, you may not have a normal bowel movement for a few days.  Please Note:  You might notice some irritation and congestion in your nose or some drainage.  This is from the oxygen used during your procedure.  There is no need for concern and it should clear up in a day or so.  SYMPTOMS TO REPORT IMMEDIATELY:   Following lower endoscopy (colonoscopy or flexible sigmoidoscopy):  Excessive amounts of blood in the stool  Significant tenderness or worsening of abdominal pains  Swelling of the abdomen that is new, acute  Fever of 100F or higher  For urgent or emergent issues, a gastroenterologist can be reached at any hour by calling [phone].   DIET:  We do recommend a small meal at first, but then you may proceed to your regular diet.  Drink plenty of fluids but you should avoid alcoholic beverages for 24 hours.  ACTIVITY:  You should plan to take it easy for the rest of today and you should NOT DRIVE or use heavy  machinery until tomorrow (because of the sedation medicines used during the test).    FOLLOW UP: Our staff will call the number listed on your records 48-72 hours following your procedure to check on you and address any questions or concerns that you may have regarding the information given to you following your procedure. If we do not reach you, we will leave a message.  We will attempt to reach you two times.  During this call, we will ask if you have developed any symptoms of COVID 19. If you develop any symptoms (ie: fever, flu-like symptoms, shortness of breath, cough etc.) before then, please call [phone].  If you test positive for Covid 19 in the 2 weeks post procedure, please call and report this information to [place].    If any biopsies were taken you will be contacted by phone or by letter within the next 1-3 weeks.  Please call us at [phone] if you have not heard about the biopsies in 3 weeks.    SIGNATURES/CONFIDENTIALITY: You and/or your care partner have signed paperwork which will be entered into your electronic medical record.  These signatures attest to the fact that that the information above on your After Visit Summary has been reviewed and is understood.  Full responsibility of the confidentiality of this discharge information lies with you and/or your care-partner.

## 2018-11-15 NOTE — Progress Notes (Signed)
PT taken to PACU. Monitors in place. VSS. Report given to RN. 

## 2018-11-15 NOTE — Op Note (Signed)
[place] Patient Name: Anderw Cho Procedure Date: 11/15/2018 9:09 AM MRN: [id number] Endoscopist: Gerrit Heck , MD Age: 48 Referring MD:  Date of Birth: 07-02-1970 Gender: Male Account #: [id number] Procedure:                Colonoscopy Indications:              Hematochezia Medicines:                Monitored Anesthesia Care Procedure:                Pre-Anesthesia Assessment:                           - Prior to the procedure, a History and Physical                            was performed, and patient medications and                            allergies were reviewed. The patient's tolerance of                            previous anesthesia was also reviewed. The risks                            and benefits of the procedure and the sedation                            options and risks were discussed with the patient.                            All questions were answered, and informed consent                            was obtained. Prior Anticoagulants: The patient has                            taken no previous anticoagulant or antiplatelet                            agents. ASA Grade Assessment: II - A patient with                            mild systemic disease. After reviewing the risks                            and benefits, the patient was deemed in                            satisfactory condition to undergo the procedure.                           After obtaining informed consent, the colonoscope  was passed under direct vision. Throughout the                            procedure, the patient's blood pressure, pulse, and                            oxygen saturations were monitored continuously. The                            Colonoscope was introduced through the anus and                            advanced to the the terminal ileum. The colonoscopy                            was performed without difficulty. The patient                            tolerated the procedure well. The quality of the                            bowel preparation was adequate. The terminal ileum,                            ileocecal valve, appendiceal orifice, and rectum                            were photographed. Scope In: 9:17:59 AM Scope Out: 9:34:58 AM Scope Withdrawal Time: 0 hours 15 minutes 23 seconds  Total Procedure Duration: 0 hours 16 minutes 59 seconds  Findings:                 The perianal and digital rectal examinations were                            normal.                           Three sessile polyps were found in the sigmoid                            colon, descending colon and ascending colon. The                            polyps were 2 to 5 mm in size. These polyps were                            removed with a cold snare. Resection and retrieval                            were complete. Estimated blood loss was minimal.                           A 12 mm polyp was found in the sigmoid colon. The  polyp was pedunculated. The polyp was removed with                            a hot snare. Resection and retrieval were complete.                            Estimated blood loss: none.                           Multiple small-mouthed diverticula were found in                            the sigmoid colon.                           Non-bleeding internal hemorrhoids were found during                            retroflexion. The hemorrhoids were small.                           The terminal ileum appeared normal. Complications:            No immediate complications. Estimated Blood Loss:     Estimated blood loss was minimal. Impression:               - Three 2 to 5 mm polyps in the sigmoid colon, in                            the descending colon and in the ascending colon,                            removed with a cold snare. Resected and retrieved.                           - One 12 mm  polyp in the sigmoid colon, removed                            with a hot snare. Resected and retrieved.                           - Diverticulosis in the sigmoid colon.                           - Non-bleeding internal hemorrhoids.                           - The examined portion of the ileum was normal. Recommendation:           - Patient has a contact number available for                            emergencies. The signs and symptoms of potential  delayed complications were discussed with the                            patient. Return to normal activities tomorrow.                            Written discharge instructions were provided to the                            patient.                           - Resume previous diet.                           - Continue present medications.                           - Await pathology results.                           - Repeat colonoscopy in 3 - 5 years for                            surveillance based on pathology results.                           - Return to GI office PRN.                           - Use fiber, for example Citrucel, Fibercon, Konsyl                            or Metamucil.                           - Internal hemorrhoids were noted on this study and                            may be amenable to hemorrhoid band ligation. If you                            are interested in further treatment of these                            hemorrhoids with band ligation, please contact my                            clinic to set up an appointment for evaluation and                            treatment. Gerrit Heck, MD 11/15/2018 9:39:06 AM

## 2018-11-15 NOTE — Progress Notes (Signed)
Called to room to assist during endoscopic procedure.  Patient ID and intended procedure confirmed with present staff. Received instructions for my participation in the procedure from the performing physician.  

## 2018-11-17 ENCOUNTER — Telehealth: Payer: Self-pay | Admitting: *Deleted

## 2018-11-17 ENCOUNTER — Telehealth: Payer: Self-pay

## 2018-11-17 NOTE — Telephone Encounter (Signed)
Attempted to reach patient for post-procedure f/u call. No answer. Left message that we will make another attempt to reach him again later today and for him to please not hesitate to call us if he has any questions/concerns regarding his care. 

## 2018-11-17 NOTE — Telephone Encounter (Signed)
  Follow up Call-  Call back number 11/15/2018 11/10/2017  Post procedure Call Back phone  # [phone]  Permission to leave phone message Yes Yes     Patient questions:  Do you have a fever, pain , or abdominal swelling? No. Pain Score  0 *  Have you tolerated food without any problems? Yes.    Have you been able to return to your normal activities? Yes.    Do you have any questions about your discharge instructions: Diet   No. Medications  No. Follow up visit  No.  Do you have questions or concerns about your Care? No.  Actions: * If pain score is 4 or above: No action needed, pain <4.  1. Have you developed a fever since your procedure? no  2.   Have you had an respiratory symptoms (SOB or cough) since your procedure? no  3.   Have you tested positive for COVID 19 since your procedure no  4.   Have you had any family members/close contacts diagnosed with the COVID 19 since your procedure?  no   If yes to any of these questions please route to Joylene John, RN and Alphonsa Gin, Therapist, sports.

## 2018-11-26 ENCOUNTER — Encounter: Payer: Self-pay | Admitting: Gastroenterology

## 2018-12-08 ENCOUNTER — Other Ambulatory Visit: Payer: Self-pay | Admitting: Physician Assistant

## 2018-12-08 DIAGNOSIS — K2100 Gastro-esophageal reflux disease with esophagitis, without bleeding: Secondary | ICD-10-CM

## 2019-01-11 ENCOUNTER — Other Ambulatory Visit: Payer: Self-pay | Admitting: Physician Assistant

## 2019-01-11 DIAGNOSIS — K2100 Gastro-esophageal reflux disease with esophagitis, without bleeding: Secondary | ICD-10-CM

## 2019-02-04 ENCOUNTER — Ambulatory Visit (INDEPENDENT_AMBULATORY_CARE_PROVIDER_SITE_OTHER): Payer: 59 | Admitting: Osteopathic Medicine

## 2019-02-04 ENCOUNTER — Encounter: Payer: Self-pay | Admitting: Osteopathic Medicine

## 2019-02-04 ENCOUNTER — Other Ambulatory Visit: Payer: Self-pay

## 2019-02-04 VITALS — BP 137/98 | HR 89 | Temp 98.3°F | Wt 197.1 lb

## 2019-02-04 DIAGNOSIS — R7401 Elevation of levels of liver transaminase levels: Secondary | ICD-10-CM | POA: Diagnosis not present

## 2019-02-04 DIAGNOSIS — K824 Cholesterolosis of gallbladder: Secondary | ICD-10-CM | POA: Diagnosis not present

## 2019-02-04 DIAGNOSIS — R195 Other fecal abnormalities: Secondary | ICD-10-CM | POA: Diagnosis not present

## 2019-02-04 DIAGNOSIS — R002 Palpitations: Secondary | ICD-10-CM

## 2019-02-04 NOTE — Patient Instructions (Signed)
Plan:  We will get some blood work and ultrasound today, I suspect the pale stool and bloating is simply due to liver/gallbladder issue.  Depending on what we find on the labs/ultrasound, we may refer back to gastroenterology for more specialized testing/treatment.  Avoid fatty foods in the meantime.  We will get you set up for a heart monitor to evaluate the palpitations.  This sounds like something called PVCs, premature ventricular contractions.  These are not serious but can be bothersome.  You should get a call to get the heart monitor placed, let us know if you do not hear something within a week

## 2019-02-04 NOTE — Progress Notes (Signed)
HPI: [name] [name] is a 48 y.o. male who  has a past medical history of Allergy, Barrett's esophagus determined by biopsy, Chronic sinusitis, Gallbladder polyp (09/21/2018), GERD (gastroesophageal reflux disease), Helicobacter pylori gastritis, Hiatal hernia, and Mixed hyperlipidemia (03/11/2018).  he presents to [facility] today, 02/04/19,  for chief complaint of:  Pale stool   . Quality: pale stool and multiple BM per day, not watery or hard, regular consistency  . Duration: few months  . Modifying factors: nothing makes better or worse  . Assoc signs/symptoms: abdominal bloating and belching, RUQ pain, nausea   Patient also reports some issues with palpitations, feels heart racing/thud may be a couple of times per month.  No associated dizziness, shortness of breath, chest pain.  He brought this up about a year ago, EKG was performed but no other follow-up     At today's visit 02/04/19 ... PMH, PSH, FH reviewed and updated as needed.  Current medication list and allergy/intolerance hx reviewed and updated as needed. (See remainder of HPI, ROS, Phys Exam below)   No results found.  No results found for this or any previous visit (from the past 72 hour(s)).  Ultrasound of abdomen 09/17/2018: No definite hepatobiliary abnormalities, other than small gallbladder polyp 4 mm.  Labs 09/04/2018: Elevated ALT to 61, AST was normal at 19.   ASSESSMENT/PLAN: The primary encounter diagnosis was Pale stool. Diagnoses of Elevated ALT measurement, Gallbladder polyp, and Palpitation were also pertinent to this visit.  Orders Placed This Encounter  Procedures  . US Abdomen Limited RUQ #1  . Gamma GT #1  . Bilirubin, fractionated (tot/dir/indir)  . Hepatic function panel  . TSH  . BASIC METABOLIC PANEL WITH GFR  . Cardiac event monitor     No orders of the defined types were placed in this encounter.   Patient Instructions  Plan:  We will get some  blood work and ultrasound today, I suspect the pale stool and bloating is simply due to liver/gallbladder issue.  Depending on what we find on the labs/ultrasound, we may refer back to gastroenterology for more specialized testing/treatment.  Avoid fatty foods in the meantime.  We will get you set up for a heart monitor to evaluate the palpitations.  This sounds like something called PVCs, premature ventricular contractions.  These are not serious but can be bothersome.  You should get a call to get the heart monitor placed, let us know if you do not hear something within a week        Follow-up plan: Return for RECHECK PENDING RESULTS OF LABS, ULTRASOUND, HEART MONITOR / IF WORSE OR CHANGE.                                                 ################################################# ################################################# ################################################# #################################################    Current Meds  Medication Sig  . albuterol (PROVENTIL HFA;VENTOLIN HFA) 108 (90 Base) MCG/ACT inhaler Inhale 2 puffs into the lungs every 6 (six) hours as needed for wheezing or shortness of breath.  . fexofenadine (ALLEGRA) 180 MG tablet Take 1 tablet (180 mg total) by mouth at bedtime.  Marland Kitchen ipratropium (ATROVENT) 0.06 % nasal spray Place 2 sprays into both nostrils 4 (four) times daily.  Marland Kitchen omeprazole (PRILOSEC) 40 MG capsule TAKE ONE CAPSULE BY MOUTH DAILY  . rifaximin (XIFAXAN) 550 MG TABS tablet Take  1 tablet (550 mg total) by mouth 3 (three) times daily. For two weeks.    No Known Allergies     Review of Systems:  Constitutional: No recent illness  HEENT: No  headache, no vision change  Cardiac: No  chest pain, No  pressure, No palpitations  Respiratory:  No  shortness of breath. No  Cough  Gastrointestinal: No  abdominal pain, no change on bowel habits  Musculoskeletal: No new  myalgia/arthralgia  Skin: No  Rash  Hem/Onc: No  easy bruising/bleeding, No  abnormal lumps/bumps  Neurologic: No  weakness, No  Dizziness  Psychiatric: No  concerns with depression, No  concerns with anxiety  Exam:  BP (!) 137/98 (BP Location: Right Arm, Patient Position: Sitting, Cuff Size: Normal)   Pulse 89   Temp 98.3 F (36.8 C) (Oral)   Wt 197 lb 1.9 oz (89.4 kg)   BMI 28.28 kg/m   Constitutional: VS see above. General Appearance: alert, well-developed, well-nourished, NAD  Eyes: Normal lids and conjunctive, non-icteric sclera  Ears, Nose, Mouth, Throat: MMM, Normal external inspection ears/nares/mouth/lips/gums.  Neck: No masses, trachea midline.   Respiratory: Normal respiratory effort. no wheeze, no rhonchi, no rales  Cardiovascular: S1/S2 normal, no murmur, no rub/gallop auscultated. RRR.   Musculoskeletal: Gait normal. Symmetric and independent movement of all extremities  Abdominal: non-tender, non-distended, no appreciable organomegaly, neg Murphy's, BS WNLx4  Neurological: Normal balance/coordination. No tremor.  Skin: warm, dry, intact.   Psychiatric: Normal judgment/insight. Normal mood and affect. Oriented x3.       Visit summary with medication list and pertinent instructions was printed for patient to review, patient was advised to alert [place] if any updates are needed. All questions at time of visit were answered - patient instructed to contact office with any additional concerns. ER/RTC precautions were reviewed with the patient and understanding verbalized.   Note: Total time spent 25 minutes, greater than 50% of the visit was spent face-to-face counseling and coordinating care for the following: The primary encounter diagnosis was Pale stool. Diagnoses of Elevated ALT measurement, Gallbladder polyp, and Palpitation were also pertinent to this visit.Marland Kitchen  Please note: voice recognition software was used to produce this document, and typos may escape  review. Please contact Dr. Sheppard Coil for any needed clarifications.    Follow up plan: Return for RECHECK PENDING RESULTS OF LABS, ULTRASOUND, HEART MONITOR / IF WORSE OR CHANGE.

## 2019-02-05 LAB — BASIC METABOLIC PANEL WITH GFR
BUN: 9 mg/dL (ref 7–25)
CO2: 30 mmol/L (ref 20–32)
Calcium: 9.3 mg/dL (ref 8.6–10.3)
Chloride: 105 mmol/L (ref 98–110)
Creat: 0.96 mg/dL (ref 0.60–1.35)
GFR, Est African American: 109 mL/min/{1.73_m2} (ref >=60)
GFR, Est Non African American: 94 mL/min/{1.73_m2} (ref >=60)
Glucose, Bld: 90 mg/dL (ref 65–99)
Potassium: 4.2 mmol/L (ref 3.5–5.3)
Sodium: 141 mmol/L (ref 135–146)

## 2019-02-05 LAB — HEPATIC FUNCTION PANEL
AG Ratio: 1.7 (calc) (ref 1.0–2.5)
ALT: 23 U/L (ref 9–46)
AST: 20 U/L (ref 10–40)
Albumin: 4.6 g/dL (ref 3.6–5.1)
Alkaline phosphatase (APISO): 40 U/L (ref 36–130)
Bilirubin, Direct: 0.1 mg/dL (ref 0.0–0.2)
Globulin: 2.7 g/dL (calc) (ref 1.9–3.7)
Indirect Bilirubin: 0.4 mg/dL (calc) (ref 0.2–1.2)
Total Bilirubin: 0.5 mg/dL (ref 0.2–1.2)
Total Protein: 7.3 g/dL (ref 6.1–8.1)

## 2019-02-05 LAB — GAMMA GT: GGT: 43 U/L (ref 3–95)

## 2019-02-05 LAB — TSH: TSH: 0.97 mIU/L (ref 0.40–4.50)

## 2019-02-08 ENCOUNTER — Other Ambulatory Visit: Payer: Self-pay

## 2019-02-08 ENCOUNTER — Ambulatory Visit (INDEPENDENT_AMBULATORY_CARE_PROVIDER_SITE_OTHER): Payer: 59

## 2019-02-08 DIAGNOSIS — R195 Other fecal abnormalities: Secondary | ICD-10-CM | POA: Diagnosis not present

## 2019-02-08 DIAGNOSIS — R7401 Elevation of levels of liver transaminase levels: Secondary | ICD-10-CM | POA: Diagnosis not present

## 2019-02-08 DIAGNOSIS — K824 Cholesterolosis of gallbladder: Secondary | ICD-10-CM | POA: Diagnosis not present

## 2019-02-15 ENCOUNTER — Encounter: Payer: 59 | Admitting: Family

## 2019-02-15 NOTE — Progress Notes (Deleted)
    Office Visit    Patient Name: [name] [name] Date of Encounter: 02/15/2019  Primary Care Provider:  Trixie Dredge, PA-C Primary Cardiologist:  No primary care provider on file. Electrophysiologist:  None   Chief Complaint    [name] [name] is a 48 y.o. male with a hx of allergic rhinitis, Barrett's esophagus, chronic sinusitis, galbladder polyp (09/21/18), GERD, h.pylori, hiatal hernia, mixed HLD presents today for palpitations ZIO monitor.   Past Medical History    Past Medical History:  Diagnosis Date  . Allergy   . Barrett's esophagus determined by biopsy   . Chronic sinusitis   . Gallbladder polyp 09/21/2018   4 mm  . GERD (gastroesophageal reflux disease)   . Helicobacter pylori gastritis   . Hiatal hernia   . Mixed hyperlipidemia 03/11/2018   Past Surgical History:  Procedure Laterality Date  . SINUS IRRIGATION    . UPPER GI ENDOSCOPY      Allergies  No Known Allergies  History of Present Illness    [name] [name] is a 48 y.o. male with a hx of allergic rhinitis, Barrett's esophagus, chronic sinusitis, galbladder polyp (09/21/18), GERD, h.pylori, hiatal hernia, mixed HLD. Seen at hi PCP's office by Dr. Sheppard Coil. He was noted to have palpitations and recommended for ZIO monitor.   EKGs/Labs/Other Studies Reviewed:   The following studies were reviewed today:  EKG:  EKG is ordered today.  The ekg ordered today demonstrates ***  Recent Labs: 03/10/2018: Hemoglobin 14.9; Platelets 238 02/04/2019: ALT 23; BUN 9; Creat 0.96; Potassium 4.2; Sodium 141; TSH 0.97  Recent Lipid Panel    Component Value Date/Time   CHOL 264 (H) 03/10/2018 1208   TRIG 149 03/10/2018 1208   HDL 42 03/10/2018 1208   CHOLHDL 6.3 (H) 03/10/2018 1208   LDLCALC 191 (H) 03/10/2018 1208    Home Medications   No outpatient medications have been marked as taking for the 02/15/19 encounter (Appointment) with Loel Dubonnet, NP.    Review of Systems    ***   ROS All  other systems reviewed and are otherwise negative except as noted above.  Physical Exam    VS:  There were no vitals taken for this visit. , BMI There is no height or weight on file to calculate BMI. GEN: Well nourished, well developed, in no acute distress. HEENT: normal. Neck: Supple, no JVD, carotid bruits, or masses. Cardiac: ***RRR, no murmurs, rubs, or gallops. No clubbing, cyanosis, edema.  ***Radials/DP/PT 2+ and equal bilaterally.  Respiratory:  ***Respirations regular and unlabored, clear to auscultation bilaterally. GI: Soft, nontender, nondistended, BS + x 4. MS: No deformity or atrophy. Skin: Warm and dry, no rash. Neuro:  Strength and sensation are intact. Psych: Normal affect.  Accessory Clinical Findings    ECG personally reviewed by me today - *** - no acute changes.  Assessment & Plan    1. Palpitations -  2. Mixed HLD - Lipid panel 03/10/18 total 264, HDL 42, LDL 191, triglycerides 149.  3. GERD -   Disposition: Follow up {follow up:15908} with ***   Loel Dubonnet, NP 02/15/2019, 8:54 AM

## 2019-02-21 ENCOUNTER — Encounter: Payer: Self-pay | Admitting: Family

## 2019-02-21 ENCOUNTER — Other Ambulatory Visit: Payer: Self-pay

## 2019-02-21 ENCOUNTER — Ambulatory Visit (INDEPENDENT_AMBULATORY_CARE_PROVIDER_SITE_OTHER): Payer: 59 | Admitting: Family

## 2019-02-21 VITALS — BP 134/82 | HR 79 | Ht 70.0 in | Wt 196.0 lb

## 2019-02-21 DIAGNOSIS — R002 Palpitations: Secondary | ICD-10-CM | POA: Diagnosis not present

## 2019-02-21 DIAGNOSIS — E782 Mixed hyperlipidemia: Secondary | ICD-10-CM | POA: Diagnosis not present

## 2019-02-21 MED ORDER — ROSUVASTATIN CALCIUM 5 MG PO TABS: 5.0000 mg | ORAL_TABLET | Freq: Every day | ORAL | 0 refills | Status: DC

## 2019-02-21 NOTE — Progress Notes (Signed)
Cardiology Clinic Note   Patient Name: Ignasio Roseberry Date of Encounter: 02/21/2019  Primary Care Provider:  Trixie Dredge, PA-C Primary Cardiologist:  Berniece Salines, DO Electrophysiologist:  None   Patient Profile    Levonne Mast is a 48 y.o. male with a hx of  hx of allergic rhinitis, Barrett's esophagus, chronic sinusitis, galbladder polyp (09/21/18), GERD, h.pylori, hiatal hernia, mixed HLD . Presents today for palpitations and placement of ZIO monitor.   Past Medical History    Past Medical History:  Diagnosis Date  . Allergy   . Barrett's esophagus determined by biopsy   . Chronic sinusitis   . Gallbladder polyp 09/21/2018   4 mm  . GERD (gastroesophageal reflux disease)   . Helicobacter pylori gastritis   . Hiatal hernia   . Mixed hyperlipidemia 03/11/2018   Past Surgical History:  Procedure Laterality Date  . SINUS IRRIGATION    . UPPER GI ENDOSCOPY      Allergies  No Known Allergies  History of Present Illness   Kedan Vitatoe is a 48 y.o. male with a hx of  hx of  hx of allergic rhinitis, Barrett's esophagus, chronic sinusitis, galbladder polyp (09/21/18), GERD, h.pylori, hiatal hernia, mixed HLD. Presents today after referral from PCP for palpitations and placement of ZIO monitor.   Reports palpitations onset about six months ago. Describes two different types of palpitations. A "flutter" or "flipping" sensation that occurs intermittently. He also describes a "forceful" heart beat that has woken him from sleep a couple times. These palpitations occur less than once per week. They last a few moments and self resolve. He has noted no triggers nor relieving factors.   He is a never smoker. Reports he drinks socially. He drinks sugar-free Monster drinks which are high in caffeine. Tells me he used to drink 3 per day but is down to 1 per day. 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  Takes no over the counter no prescribed pro-arrthymic medications.   Reports family history of coronary artery disease and MI on his mom's side. He has a personal history of DLD. Tells me he took Atorvastatin as prescribed by his PCP previously for a few week and then got worried about muscle aches so he stopped. Is agreeable to try to resume statin therapy.  He reports he gets chest pain when he gets anxious. Tells me this has been happening for "awhile". It lasts minutes to hours. It is exacerbated by anxiety. It self resolves. It is described as a "stabbing" pain that does not radiate. He has no chest pain with activity.   He has not formal exercise routine. Tells me he used to exercise regularly but has gotten out of the habit. Endorses he does not watch his diet as closely as he should.   EKGs/Labs/Other Studies Reviewed:   The following studies were reviewed today:  Recent Labs: 03/10/2018: Hemoglobin 14.9; Platelets 238 02/04/2019: ALT 23; BUN 9; Creat 0.96; Potassium 4.2; Sodium 141; TSH 0.97  Recent Lipid Panel    Component Value Date/Time   CHOL 264 (H) 03/10/2018 1208   TRIG 149 03/10/2018 1208   HDL 42 03/10/2018 1208   CHOLHDL 6.3 (H) 03/10/2018 1208   LDLCALC 191 (H) 03/10/2018 1208    Home Medications    Current Meds  Medication Sig  . albuterol (PROVENTIL HFA;VENTOLIN HFA) 108 (90 Base) MCG/ACT inhaler Inhale 2 puffs into the lungs every 6 (  six) hours as needed for wheezing or shortness of breath.  . fexofenadine (ALLEGRA) 180 MG tablet Take 1 tablet (180 mg total) by mouth at bedtime.  Marland Kitchen ipratropium (ATROVENT) 0.06 % nasal spray Place 2 sprays into both nostrils 4 (four) times daily.  Marland Kitchen omeprazole (PRILOSEC) 40 MG capsule TAKE ONE CAPSULE BY MOUTH DAILY     Family History    Family History  Problem Relation Age of Onset  . Hypertension Father   . Throat cancer Father   . Esophageal cancer Father   . Stomach cancer Brother   . Colon cancer Paternal Aunt   .  Rectal cancer Neg Hx   . Colon polyps Neg Hx    He indicated that the status of his father is unknown. He indicated that the status of his brother is unknown. He indicated that the status of his paternal aunt is unknown. He indicated that the status of his neg hx is unknown.  Social History    Social History   Socioeconomic History  . Marital status: Single    Spouse name: Not on file  . Number of children: 4  . Years of education: Not on file  . Highest education level: Not on file  Occupational History  . Occupation: Curator  Social Needs  . Financial resource strain: Not on file  . Food insecurity    Worry: Not on file    Inability: Not on file  . Transportation needs    Medical: Not on file    Non-medical: Not on file  Tobacco Use  . Smoking status: Never Smoker  . Smokeless tobacco: Never Used  Substance and Sexual Activity  . Alcohol use: Yes    Alcohol/week: 14.0 standard drinks    Types: 14 Cans of beer per week  . Drug use: No  . Sexual activity: Yes    Birth control/protection: None  Lifestyle  . Physical activity    Days per week: Not on file    Minutes per session: Not on file  . Stress: Not on file  Relationships  . Social Herbalist on phone: Not on file    Gets together: Not on file    Attends religious service: Not on file    Active member of club or organization: Not on file    Attends meetings of clubs or organizations: Not on file    Relationship status: Not on file  . Intimate partner violence    Fear of current or ex partner: Not on file    Emotionally abused: Not on file    Physically abused: Not on file    Forced sexual activity: Not on file  Other Topics Concern  . Not on file  Social History Narrative  . Not on file     Review of Systems    Review of Systems  Constitution: Negative for chills, fever and malaise/fatigue.  Cardiovascular: Positive for chest pain and palpitations. Negative for dyspnea on exertion, leg  swelling, near-syncope, orthopnea and paroxysmal nocturnal dyspnea.  Respiratory: Negative for cough, shortness of breath and wheezing.   Gastrointestinal: Negative for nausea and vomiting.  Neurological: Negative for dizziness, light-headedness and weakness.   Physical Exam    VS:  BP 134/82 (BP Location: Left Arm, Patient Position: Sitting)   Pulse 79   Ht 5\' 10"  (1.778 m)   Wt 196 lb (88.9 kg)   SpO2 96%   BMI 28.12 kg/m  , BMI Body mass index is 28.12 kg/m.  GEN: Well nourished, well developed, in no acute distress. HEENT: Normal. Neck: Supple, no JVD, carotid bruits, or masses. Cardiac: RRR, no murmurs, rubs, or gallops. No clubbing, cyanosis, edema.  Radials/DP/PT 2+ and equal bilaterally.  Respiratory:  Respirations regular and unlabored, clear to auscultation bilaterally. GI: Soft, nontender, nondistended, BS + x 4. MS: No deformity or atrophy. Skin: Warm and dry, no rash. Neuro:  Strength and sensation are intact. Psych: Normal affect.  Accessory Clinical Findings    ECG personally reviewed by me today- SR rate 62 bpm with poor R wave progression - No acute changes  Assessment & Plan   1. Palpitations - Onset approx 6 months ago. Sensation of "flipping" as well as "forceful heart beat" at rest and with activity. Likely etiology PVC vs anxiety. Labs 02/04/19 with normal electrolytes, normal TSH.   14 day ZIO placed today.   Encouraged reduced caffeine intake as he drinks Monster daily.  Encouraged stress reduction techniques as reports increased workplace stress recently.   2. Chest pain - Long history of intermittent midsternal "sharp" chest pain associated with anxiety. Does not radiate, not associated with activity, not associated with diaphoresis. EKG today SR with poor R wave progression, no acute ST/T wave changes. Low suspicion angina. No indication for ischemic evaluation at this time. He may be a good candidate for calcium score due to family hx of CAD and  personal hx of DLD. Will discuss at upcoming office visit. Have asked him to trigger the ZIO while wearing for any episodes of pain so we can assess underlying heart rhythm.  3. Mixed HLD - Lipid panel 03/10/18 total cholesterol 264, HDL 42, LDL 191, triglycerices 149. Took Atorvastatin for a short time then self-stopped as he was concerned about myalgias.   Lipid panel, direct LDL today.   Given family history of CAD will require statin therapy. Start Crestor 5mg  - he will take 3x per week for 2 weeks then increase to daily.  4. GERD - Follows with his PCP. Well controlled on Omeprazole.  Disposition: Follow up in 5 week(s) with Dr. Harriet Masson.  Loel Dubonnet, NP  02/21/2019, 4:05 PM

## 2019-02-21 NOTE — Patient Instructions (Signed)
Medication Instructions:  Your physician has recommended you make the following change in your medication:  START Crestor 5mg  one tablet three times per week for 2 weeks. Then switch to 1 tablet daily.  *If you need a refill on your cardiac medications before your next appointment, please call your pharmacy*  Lab Work: Your physician recommends that you return for lab work today: lipid profile, direct LDL  If you have labs (blood work) drawn today and your tests are completely normal, you will receive your results only by: Marland Kitchen MyChart Message (if you have MyChart) OR . A paper copy in the mail If you have any lab test that is abnormal or we need to change your treatment, we will call you to review the results.  Testing/Procedures: You had a ZIO palced today. Please wear for 14 days. Please return on 03/07/19 by mail in the box. Simply place in your regular mail box. You may shower with the monitor, but please avoid baths, swimming, and hot tubs.  Follow-Up: At [facility], you and your health needs are our priority.  As part of our continuing mission to provide you with exceptional heart care, we have created designated Provider Care Teams.  These Care Teams include your primary Cardiologist (physician) and Advanced Practice Providers (APPs -  Physician Assistants and Nurse Practitioners) who all work together to provide you with the care you need, when you need it.  Your next appointment:   5 week(s)  The format for your next appointment:   In Person  Provider:   Berniece Salines, DO  Other Instructions  Medications and Palpitations 1. Avoid all over-the-counter antihistamines except Claritin/Loratadine and Zyrtec/Cetrizine. 2. Avoid all combination including cold sinus allergies flu decongestant and sleep medications 3. You can use Robitussin DM Mucinex and Mucinex DM for cough. 4. can use Tylenol aspirin ibuprofen and naproxen but no combinations such as sleep or sinus.    Palpitations Palpitations are feelings that your heartbeat is not normal. Your heartbeat may feel like it is:  Uneven.  Faster than normal.  Fluttering.  Skipping a beat. This is usually not a serious problem. In some cases, you may need tests to rule out any serious problems. Follow these instructions at home: Pay attention to any changes in your condition. Take these actions to help manage your symptoms: Eating and drinking  Avoid: ? Coffee, tea, soft drinks, and energy drinks. ? Chocolate. ? Alcohol. ? Diet pills. Lifestyle   Try to lower your stress. These things can help you relax: ? Yoga. ? Deep breathing and meditation. ? Exercise. ? Using words and images to create positive thoughts (guided imagery). ? Using your mind to control things in your body (biofeedback).  Do not use drugs.  Get plenty of rest and sleep. Keep a regular bed time. General instructions   Take over-the-counter and prescription medicines only as told by your doctor.  Do not use any products that contain nicotine or tobacco, such as cigarettes and e-cigarettes. If you need help quitting, ask your doctor.  Keep all follow-up visits as told by your doctor. This is important. You may need more tests if palpitations do not go away or get worse. Contact a doctor if:  Your symptoms last more than 24 hours.  Your symptoms occur more often. Get help right away if you:  Have chest pain.  Feel short of breath.  Have a very bad headache.  Feel dizzy.  Pass out (faint). Summary  Palpitations are feelings that  your heartbeat is uneven or faster than normal. It may feel like your heart is fluttering or skipping a beat.  Avoid food and drinks that may cause palpitations. These include caffeine, chocolate, and alcohol.  Try to lower your stress. Do not smoke or use drugs.  Get help right away if you faint or have chest pain, shortness of breath, a severe headache, or dizziness. This  information is not intended to replace advice given to you by your health care provider. Make sure you discuss any questions you have with your health care provider. Document Released: 12/18/2007 Document Revised: 04/22/2017 Document Reviewed: 04/22/2017 Elsevier Patient Education  2020 [employer].

## 2019-02-22 ENCOUNTER — Encounter: Payer: Self-pay | Admitting: Family

## 2019-02-22 LAB — LIPID PANEL
Chol/HDL Ratio: 8.3 ratio — ABNORMAL HIGH (ref 0.0–5.0)
Cholesterol, Total: 331 mg/dL — ABNORMAL HIGH (ref 100–199)
HDL: 40 mg/dL (ref >39)
LDL Chol Calc (NIH): 247 mg/dL — ABNORMAL HIGH (ref 0–99)
Triglycerides: 215 mg/dL — ABNORMAL HIGH (ref 0–149)
VLDL Cholesterol Cal: 44 mg/dL — ABNORMAL HIGH (ref 5–40)

## 2019-02-22 LAB — LDL CHOLESTEROL, DIRECT: LDL Direct: 257 mg/dL — ABNORMAL HIGH (ref 0–99)

## 2019-03-11 ENCOUNTER — Ambulatory Visit (INDEPENDENT_AMBULATORY_CARE_PROVIDER_SITE_OTHER): Payer: 59 | Admitting: Physician Assistant

## 2019-03-11 ENCOUNTER — Encounter: Payer: Self-pay | Admitting: Physician Assistant

## 2019-03-11 VITALS — BP 119/93 | Temp 99.4°F | Ht 70.0 in | Wt 185.0 lb

## 2019-03-11 DIAGNOSIS — B349 Viral infection, unspecified: Secondary | ICD-10-CM | POA: Diagnosis not present

## 2019-03-11 DIAGNOSIS — J0100 Acute maxillary sinusitis, unspecified: Secondary | ICD-10-CM | POA: Diagnosis not present

## 2019-03-11 MED ORDER — AMOXICILLIN-POT CLAVULANATE 875-125 MG PO TABS: 1.0000 | ORAL_TABLET | Freq: Two times a day (BID) | ORAL | 0 refills | Status: DC

## 2019-03-11 MED ORDER — FLUTICASONE PROPIONATE 50 MCG/ACT NA SUSP: 2.0000 | Freq: Every day | NASAL | 6 refills | Status: DC

## 2019-03-11 NOTE — Progress Notes (Signed)
Patient ID: Lonza Methot, male   DOB: 1970-08-02, 48 y.o.   MRN: [id number] .Marland KitchenVirtual Visit via Telephone Note  I connected with Morireoluwa Shippen on 03/11/19 at 11:10 AM EST by telephone and verified that I am speaking with the correct person using two identifiers.  Location: Patient: home Provider: clinic   I discussed the limitations, risks, security and privacy concerns of performing an evaluation and management service by telephone and the availability of in person appointments. I also discussed with the patient that there may be a patient responsible charge related to this service. The patient expressed understanding and agreed to proceed.   History of Present Illness: Patient is a 48 year old male with facial pain, sinus pressure, headache, congestion, runny nose, dry cough, body aches who comes into the clinic for evaluation.  Patient denies any known direct Covid exposure.  He denies any sick contacts.  He has not tried anything over-the-counter.  He has a history of sinusitis which he has been treated many times before.  He feels like that.  Patient denies any shortness of breath, sore throat, change in taste and smell.   .. Active Ambulatory Problems    Diagnosis Date Noted  . GERD with esophagitis 07/17/2016  . Elevated blood pressure reading 07/17/2016  . Chronic maxillary sinusitis 04/09/2015  . History of Helicobacter pylori infection 10/08/2017  . Chronic rhinitis 10/08/2017  . Bilateral impacted cerumen 10/08/2017  . Proteinuria 03/10/2018  . Urethral discharge in male, without blood 03/10/2018  . Mixed hyperlipidemia 03/11/2018  . Decreased calculated GFR 03/29/2018  . Elevated ALT measurement 09/13/2018  . Renal insufficiency 09/13/2018  . 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 Ketonuria 09/13/2018  . Abdominal distension 09/13/2018  . Left upper quadrant abdominal tenderness without rebound tenderness 09/13/2018  . History of bloody stools 09/13/2018  . Gallbladder polyp  09/21/2018   Resolved Ambulatory Problems    Diagnosis Date Noted  . Acute pain of right shoulder 07/17/2016   Past Medical History:  Diagnosis Date  . Allergy   . Barrett's esophagus determined by biopsy   . Chronic sinusitis   . GERD (gastroesophageal reflux disease)   . Helicobacter pylori gastritis   . Hiatal hernia    Reviewed medication, allergy, problem list.     Observations/Objective: No acute distress. Normal appearance and mood.  Normal breathing.  No cough on exam. .   .. Today's Vitals   03/11/19 1046  BP: (!) 119/93  Temp: 99.4 F (37.4 C)  TempSrc: Oral  Weight: 185 lb (83.9 kg)  Height: 5\' 10"  (1.778 m)   Body mass index is 26.54 kg/m.    Assessment and Plan: Marland KitchenMarland KitchenDiagnoses and all orders for this visit:  Acute non-recurrent maxillary sinusitis -     fluticasone (FLONASE) 50 MCG/ACT nasal spray; Place 2 sprays into both nostrils daily. -     amoxicillin-clavulanate (AUGMENTIN) 875-125 MG tablet; Take 1 tablet by mouth 2 (two) times daily. Take 1 tab twice a day for 10 days.  Viral syndrome -     fluticasone (FLONASE) 50 MCG/ACT nasal spray; Place 2 sprays into both nostrils daily.   Cannot rule out covid.  Discussed with patient that often symptoms start with Covid as feeling like something they have had before.  Being tested is more for other people and transmission and yourself.  Needs to self isolate and get tested. It is the weekend. If no improvement could start augmentin for sinus infection. Certainly sounds like sinusitis. Encouraged tylenol cold/sinus/severe.  Go ahead and start  flonase as well. Follow up as needed.    Follow Up Instructions:    I discussed the assessment and treatment plan with the patient. The patient was provided an opportunity to ask questions and all were answered. The patient agreed with the plan and demonstrated an understanding of the instructions.   The patient was advised to call back or seek an in-person  evaluation if the symptoms worsen or if the condition fails to improve as anticipated.    Iran Planas, PA-C

## 2019-03-11 NOTE — Progress Notes (Deleted)
Started a couple days ago: Facial pain Headache Nausea Congestion Runny nose Cough Body aches   No sore throat, no change in taste/smell, no chills  He will check blood pressure and give reading to [facility]

## 2019-03-21 ENCOUNTER — Other Ambulatory Visit: Payer: Self-pay | Admitting: Family

## 2019-03-21 DIAGNOSIS — E782 Mixed hyperlipidemia: Secondary | ICD-10-CM

## 2019-03-31 ENCOUNTER — Telehealth: Payer: Self-pay | Admitting: *Deleted

## 2019-03-31 NOTE — Telephone Encounter (Signed)
Spoke with pt and he is going to double check but thought that his wife had mailed the monitor back.

## 2019-03-31 NOTE — Telephone Encounter (Signed)
Left message for pt to call us back about rescheduling OV tomorrow since monitor is not back yet. According to [facility] website it has not been received from pt yet.

## 2019-04-01 ENCOUNTER — Ambulatory Visit: Payer: 59 | Admitting: Cardiology

## 2019-04-21 ENCOUNTER — Telehealth: Payer: Self-pay | Admitting: *Deleted

## 2019-04-21 ENCOUNTER — Ambulatory Visit: Payer: 59 | Admitting: Cardiology

## 2019-04-21 NOTE — Telephone Encounter (Signed)
Pt had monitor placed in November and had f/u visit today. Pt did not show up and when we checked on monitor with zio it had the monitor marked as "Lost". Need to know if pt mailed monitor back in and if he did then we would need to do another one because the one we placed apparently got lost in the mail.

## 2019-07-05 ENCOUNTER — Other Ambulatory Visit: Payer: Self-pay | Admitting: Physician Assistant

## 2019-07-05 DIAGNOSIS — J0100 Acute maxillary sinusitis, unspecified: Secondary | ICD-10-CM

## 2019-07-05 DIAGNOSIS — B349 Viral infection, unspecified: Secondary | ICD-10-CM

## 2019-10-26 ENCOUNTER — Other Ambulatory Visit: Payer: Self-pay

## 2019-10-26 ENCOUNTER — Ambulatory Visit (INDEPENDENT_AMBULATORY_CARE_PROVIDER_SITE_OTHER): Payer: 59

## 2019-10-26 ENCOUNTER — Ambulatory Visit (INDEPENDENT_AMBULATORY_CARE_PROVIDER_SITE_OTHER): Payer: 59 | Admitting: Family Medicine

## 2019-10-26 ENCOUNTER — Encounter: Payer: Self-pay | Admitting: Family Medicine

## 2019-10-26 VITALS — BP 143/88 | HR 80 | Temp 97.5°F | Wt 195.4 lb

## 2019-10-26 DIAGNOSIS — R140 Abdominal distension (gaseous): Secondary | ICD-10-CM

## 2019-10-26 DIAGNOSIS — M542 Cervicalgia: Secondary | ICD-10-CM

## 2019-10-26 DIAGNOSIS — R1013 Epigastric pain: Secondary | ICD-10-CM | POA: Insufficient documentation

## 2019-10-26 DIAGNOSIS — E782 Mixed hyperlipidemia: Secondary | ICD-10-CM | POA: Diagnosis not present

## 2019-10-26 DIAGNOSIS — K2100 Gastro-esophageal reflux disease with esophagitis, without bleeding: Secondary | ICD-10-CM

## 2019-10-26 MED ORDER — OMEPRAZOLE 40 MG PO CPDR: 40.0000 mg | DELAYED_RELEASE_CAPSULE | Freq: Two times a day (BID) | ORAL | 0 refills | Status: DC

## 2019-10-26 NOTE — Progress Notes (Signed)
[name] [name] - 49 y.o. male MRN [id number]  Date of birth: [birth year]  Subjective Chief Complaint  Patient presents with  . Establish Care    HPI [name] [name] is a 49 y.o. male here today for initial visit with me.   He has a history of GERD with esophagitis and prior H. Pylori infection and HLD.  He has concerns of continued epigastric pain and bloating and neck pain.   Has had problems with recurrent epigastric pain and bloating.  Previously had EGD by Dr. Bryan Lemma in 2019 with non specific gastritis and esophagitis.  Started on omeprazole and has been taking daily.  Symptoms increased over the past few months, some increased nausea as well as bloating.  He denies blood in stool, dark stools.  Requests to return back to GI.   Neck pain intermittent for several weeks.  Panin worse with rotational movement.  Denies radiation, numbness, tingling into extremities.  Denies headaches.    Tolerating crestor well for management of HLD.  Denies side effects.   ROS:  A comprehensive ROS was completed and negative except as noted per HPI  No Known Allergies  Past Medical History:  Diagnosis Date  . Allergy   . Barrett's esophagus determined by biopsy   . Chronic sinusitis   . Gallbladder polyp 09/21/2018   4 mm  . GERD (gastroesophageal reflux disease)   . Helicobacter pylori gastritis   . Hiatal hernia   . Mixed hyperlipidemia 03/11/2018    Past Surgical History:  Procedure Laterality Date  . SINUS IRRIGATION    . UPPER GI ENDOSCOPY      Social History   Socioeconomic History  . Marital status: Single    Spouse name: Not on file  . Number of children: 4  . Years of education: Not on file  . Highest education level: Not on file  Occupational History  . Occupation: Curator  Tobacco Use  . Smoking status: Never Smoker  . Smokeless tobacco: Never Used  Vaping Use  . Vaping Use: Never used  Substance and Sexual Activity  . Alcohol use: Yes    Alcohol/week: 14.0 standard  drinks    Types: 14 Cans of beer per week  . Drug use: No  . Sexual activity: Yes    Birth control/protection: None  Other Topics Concern  . Not on file  Social History Narrative  . Not on file   Social Determinants of Health   Financial Resource Strain:   . Difficulty of Paying Living Expenses:   Food Insecurity:   . Worried About Charity fundraiser in the Last Year:   . Arboriculturist in the Last Year:   Transportation Needs:   . Film/video editor (Medical):   Marland Kitchen Lack of Transportation (Non-Medical):   Physical Activity:   . Days of Exercise per Week:   . Minutes of Exercise per Session:   Stress:   . Feeling of Stress :   Social Connections:   . Frequency of Communication with Friends and Family:   . Frequency of Social Gatherings with Friends and Family:   . Attends Religious Services:   . Active Member of Clubs or Organizations:   . Attends Archivist Meetings:   Marland Kitchen Marital Status:     Family History  Problem Relation Age of Onset  . Hypertension Father   . Throat cancer Father   . Esophageal cancer Father   . Stomach cancer Brother   . Colon cancer Paternal  Aunt   . Rectal cancer Neg Hx   . Colon polyps Neg Hx     Health Maintenance  Topic Date Due  . Hepatitis C Screening  Never done  . COVID-19 Vaccine (1) Never done  . HIV Screening  Never done  . INFLUENZA VACCINE  10/23/2019  . TETANUS/TDAP  02/04/2020 (Originally 01/17/1992)  . COLONOSCOPY  11/14/2021     ----------------------------------------------------------------------------------------------------------------------------------------------------------------------------------------------------------------- Physical Exam BP (!) 143/88 (BP Location: Left Arm, Patient Position: Sitting, Cuff Size: Normal)   Pulse 80   Temp (!) 97.5 F (36.4 C) (Oral)   Wt 195 lb 6.4 oz (88.6 kg)   SpO2 95%   BMI 28.04 kg/m   Physical Exam Constitutional:      Appearance: Normal  appearance.  Eyes:     General: No scleral icterus. Cardiovascular:     Rate and Rhythm: Normal rate and regular rhythm.  Pulmonary:     Effort: Pulmonary effort is normal.     Breath sounds: Normal breath sounds.  Abdominal:     General: Abdomen is flat. There is no distension.     Tenderness: There is abdominal tenderness (epigastric).     Comments: Diastasis recti noted.   Skin:    General: Skin is warm and dry.  Neurological:     General: No focal deficit present.     Mental Status: He is alert.  Psychiatric:        Mood and Affect: Mood normal.     ------------------------------------------------------------------------------------------------------------------------------------------------------------------------------------------------------------------- Assessment and Plan  Epigastric pain Increase omeprazole to BID Limit NSAIDS Requests referral back to GI, orders entered.     Neck pain Xrays ordered.  Discussed limiting NSAIDS due to increased GI upset.    Mixed hyperlipidemia Tolerating crestor well.  Update lipids and CMP   Meds ordered this encounter  Medications  . omeprazole (PRILOSEC) 40 MG capsule    Sig: Take 1 capsule (40 mg total) by mouth in the morning and at bedtime.    Dispense:  180 capsule    Refill:  0    No follow-ups on file.    This visit occurred during the SARS-CoV-2 public health emergency.  Safety protocols were in place, including screening questions prior to the visit, additional usage of staff PPE, and extensive cleaning of exam room while observing appropriate contact time as indicated for disinfecting solutions.

## 2019-10-26 NOTE — Assessment & Plan Note (Signed)
Tolerating crestor well.  Update lipids and CMP

## 2019-10-26 NOTE — Patient Instructions (Addendum)
Have labs and xray completed.  We'll contact you with results.   I have entered a referral to GI.  They will contact you to set up appt.  Increase omeprazole to twice per day.

## 2019-10-26 NOTE — Assessment & Plan Note (Signed)
Xrays ordered.  Discussed limiting NSAIDS due to increased GI upset.

## 2019-10-26 NOTE — Assessment & Plan Note (Signed)
Increase omeprazole to BID Limit NSAIDS Requests referral back to GI, orders entered.

## 2019-10-27 LAB — COMPLETE METABOLIC PANEL WITH GFR
AG Ratio: 1.4 (calc) (ref 1.0–2.5)
ALT: 22 U/L (ref 9–46)
AST: 20 U/L (ref 10–40)
Albumin: 4.3 g/dL (ref 3.6–5.1)
Alkaline phosphatase (APISO): 48 U/L (ref 36–130)
BUN: 9 mg/dL (ref 7–25)
CO2: 26 mmol/L (ref 20–32)
Calcium: 9 mg/dL (ref 8.6–10.3)
Chloride: 105 mmol/L (ref 98–110)
Creat: 1.06 mg/dL (ref 0.60–1.35)
GFR, Est African American: 97 mL/min/{1.73_m2} (ref >=60)
GFR, Est Non African American: 84 mL/min/{1.73_m2} (ref >=60)
Globulin: 3.1 g/dL (calc) (ref 1.9–3.7)
Glucose, Bld: 90 mg/dL (ref 65–99)
Potassium: 4 mmol/L (ref 3.5–5.3)
Sodium: 141 mmol/L (ref 135–146)
Total Bilirubin: 0.4 mg/dL (ref 0.2–1.2)
Total Protein: 7.4 g/dL (ref 6.1–8.1)

## 2019-10-27 LAB — CBC
HCT: 49.7 % (ref 38.5–50.0)
Hemoglobin: 16.4 g/dL (ref 13.2–17.1)
MCH: 30.8 pg (ref 27.0–33.0)
MCHC: 33 g/dL (ref 32.0–36.0)
MCV: 93.4 fL (ref 80.0–100.0)
MPV: 8.7 fL (ref 7.5–12.5)
Platelets: 290 10*3/uL (ref 140–400)
RBC: 5.32 10*6/uL (ref 4.20–5.80)
RDW: 12.8 % (ref 11.0–15.0)
WBC: 5 10*3/uL (ref 3.8–10.8)

## 2019-10-27 LAB — LIPID PANEL
Cholesterol: 317 mg/dL — ABNORMAL HIGH (ref <200)
HDL: 33 mg/dL — ABNORMAL LOW (ref >=40)
LDL Cholesterol (Calc): 220 mg/dL (calc) — ABNORMAL HIGH
Non-HDL Cholesterol (Calc): 284 mg/dL (calc) — ABNORMAL HIGH (ref <130)
Total CHOL/HDL Ratio: 9.6 (calc) — ABNORMAL HIGH (ref <5.0)
Triglycerides: 377 mg/dL — ABNORMAL HIGH (ref <150)

## 2019-10-31 ENCOUNTER — Encounter: Payer: Self-pay | Admitting: Family Medicine

## 2019-10-31 NOTE — Telephone Encounter (Signed)
Routing to covering provider.  °

## 2019-11-02 ENCOUNTER — Other Ambulatory Visit: Payer: Self-pay | Admitting: Osteopathic Medicine

## 2019-11-02 DIAGNOSIS — E782 Mixed hyperlipidemia: Secondary | ICD-10-CM

## 2019-11-02 MED ORDER — ROSUVASTATIN CALCIUM 5 MG PO TABS: 5.0000 mg | ORAL_TABLET | Freq: Every day | ORAL | 3 refills | Status: DC

## 2019-12-05 ENCOUNTER — Ambulatory Visit: Payer: 59 | Admitting: Gastroenterology

## 2019-12-05 ENCOUNTER — Encounter: Payer: Self-pay | Admitting: Gastroenterology

## 2019-12-05 VITALS — BP 120/84 | HR 57 | Ht 69.0 in | Wt 194.2 lb

## 2019-12-05 DIAGNOSIS — R112 Nausea with vomiting, unspecified: Secondary | ICD-10-CM

## 2019-12-05 DIAGNOSIS — K219 Gastro-esophageal reflux disease without esophagitis: Secondary | ICD-10-CM | POA: Diagnosis not present

## 2019-12-05 DIAGNOSIS — Z86010 Personal history of colonic polyps: Secondary | ICD-10-CM | POA: Diagnosis not present

## 2019-12-05 DIAGNOSIS — R140 Abdominal distension (gaseous): Secondary | ICD-10-CM

## 2019-12-05 DIAGNOSIS — R195 Other fecal abnormalities: Secondary | ICD-10-CM

## 2019-12-05 MED ORDER — RIFAXIMIN 550 MG PO TABS: 550.0000 mg | ORAL_TABLET | Freq: Three times a day (TID) | ORAL | 1 refills | Status: AC

## 2019-12-05 NOTE — Progress Notes (Signed)
P  Chief Complaint:    Abdominal bloating, nausea emesis  GI History: 49 year old male initially seen 08/2018 for evaluation of nausea, postprandial abdominal bloating, upper abdominal discomfort, nausea without emesis.  Symptoms independent of food types.  Also with streaks of BRBPR at the time (resolved).  Similar symptoms in 2017 with treatment of H. pylori, then recurrence in 2019.  Evaluation as below:  -08/2018: Urgent care: Creatinine 1.3 (stable from prior), ALT 61, otherwise normal CMP and CBC. -09/13/2018: Seen by PCM. -08/2018: Abdominal ultrasound: Essentially normal.  GB polyp -08/2018: Prescribed trial of rifaximin and probiotic for clinical suspicion of SIBO-patient never started -10/2018: Colonoscopy: -01/2019: Normal liver enzymes, normal GGT.  Normal TSH -11/20: Abdominal ultrasound: Small GB polyps, otherwise normal  Additionally, longstanding history of GERD withindex sxs ofHB, regurgitation, and dyspepsia.  Reported history of Barrett's Esophagus in 2017, but no Barrett's on EGD in 2019.  Sxs overall controlled with omeprazole 20 mg daily, pre-breakfast. Occasaional breakthrough sxs. Exacrbated by spicy, red sauces, and bananas. No associated dysphagia, odynophagia.    Endoscopic history: -Colonoscopy (10/2018, Dr. Bryan Lemma): 12 mm pedunculated sigmoid tubular adenoma, 3 subcentimeter tubular adenomas, sigmoid diverticulosis, internal hemorrhoids.  Normal TI.  Repeat 3 years - EGD (10/2017, Dr. Bryan Lemma): 1 cm of salmon-colored mucosa (biopsy: Inflammation without Barrett's), Hill grade 2 valve, non-H. pylori gastritis, normal duodenum with normal biopsies.  Recommended repeat in 3 years due to questionable history of Barrett's - EGD (2017, [place]): H. pylori gastritis, Barrett's esophagus (only path report available for previous review, so unclear extent of PE), HH (unclear size)  HPI:     Patient is a 49 y.o. male presenting [facility] for  follow-up.  Last seen in the office in 08/2018, with colonoscopy in 10/2018 as above.  Today, he states he is again having postprandial abdominal bloating and nausea without emesis. Feels like he fills up quickly too. Stopped drinking Equities trader drinks with some improvement, but o/w unrelated to food types. Occasional watery stools, but generally remain loose.  No hematochezia.  Was previously prescribed trial of rifaximin, but never started.  Weight stable. No fevers.    -10/26/2019: Normal CBC, CMP    Review of systems:     No chest pain, no SOB, no fevers, no urinary sx   Past Medical History:  Diagnosis Date  . Allergy   . Barrett's esophagus determined by biopsy   . Chronic sinusitis   . Gallbladder polyp 09/21/2018   4 mm  . GERD (gastroesophageal reflux disease)   . Helicobacter pylori gastritis   . Hiatal hernia   . Mixed hyperlipidemia 03/11/2018    Patient's surgical history, family medical history, social history, medications and allergies were all reviewed in Epic    Current Outpatient Medications  Medication Sig Dispense Refill  . fexofenadine (ALLEGRA) 180 MG tablet Take 1 tablet (180 mg total) by mouth at bedtime. 90 tablet 3  . fluticasone (FLONASE) 50 MCG/ACT nasal spray PLACE 1 SPRAY INTO BOTH NOSTRILS DAILY. 48 mL 1  . omeprazole (PRILOSEC) 40 MG capsule Take 1 capsule (40 mg total) by mouth in the morning and at bedtime. 180 capsule 0  . rosuvastatin (CRESTOR) 5 MG tablet Take 1 tablet (5 mg total) by mouth daily. 90 tablet 3   No current facility-administered medications for this visit.    Physical Exam:     There were no vitals taken for this visit.  GENERAL:  Pleasant male in NAD PSYCH: : Cooperative, normal affect  EENT:  conjunctiva pink, mucous membranes moist, neck supple without masses CARDIAC:  RRR, no murmur heard, no peripheral edema PULM: Normal respiratory effort, lungs CTA bilaterally, no wheezing ABDOMEN:  Nondistended, soft,  nontender. No obvious masses, no hepatomegaly,  normal bowel sounds SKIN:  turgor, no lesions seen Musculoskeletal:  Normal muscle tone, normal strength NEURO: Alert and oriented x 3, no focal neurologic deficits   IMPRESSION and PLAN:    1) Abdominal bloating 2) Nausea without emesis 3) Loose stools  High clinical suspicion for SIBO.  Discussed that diagnosis and full DDX to include CSID, at length today, and plan to treat as below  - Rifaximin 550 mg PO TID x14 days, RF1 - Probiotic, such as Align, to continue 3 weeks beyond completion of rifaximin - Low FODMAP diet -Diet diary -If no response to therapy, plan for SIBO carbohydrate breath testing vs cross-sectional imaging vs trial of Sucraid  4) GERD -Well-controlled on current therapy  5) History of colon polyps -Colonoscopy in 10/2018 with 12 mm pedunculated tubular adenoma and 3 additional subcentimeter tubular adenomas.  Repeat colonoscopy in 3 years (2023) for ongoing polyp surveillance  RTC in 6 months or sooner as needed.  I spent 30 minutes of time, including in depth chart review, independent review of results as outlined above, communicating results with the patient directly, face-to-face time with the patient, coordinating care, and ordering studies and medications as appropriate, and documentation.          Lavena Bullion ,DO, FACG 12/05/2019, 8:51 AM

## 2019-12-05 NOTE — Patient Instructions (Signed)
If you are age 47 or older, your body mass index should be between 23-30. Your Body mass index is 28.69 kg/m. If this is out of the aforementioned range listed, please consider follow up with your Primary Care Provider.  If you are age 62 or younger, your body mass index should be between 19-25. Your Body mass index is 28.69 kg/m. If this is out of the aformentioned range listed, please consider follow up with your Primary Care Provider.   We have sent the following medications to your pharmacy for you to pick up at your convenience: Xifaxan 550 mg three times daily - This has been sent to [facility].  They will contact you regarding this prescription.  Start Probiotic Optician, dispensing) This is over-the-counter.  See Low FODMAP diet below.  Follow up with me in six months.  Please call for an appointment as the schedule is not available at this time.  It was a pleasure to see you today!  Gerrit Heck, D.O.  Low FODMAP Diet: (Fermentable Oligosaccharides, Disaccharides, Monosaccharides, and Polyols) These are short chain carbohydrates and sugar alcohols that are poorly absorbed by the body, resulting in multiple abdominal symptoms, including changes in bowel habits, abdominal pain/discomfort, bloating, abdominal distension, gas, etc.

## 2019-12-28 ENCOUNTER — Ambulatory Visit: Payer: 59 | Admitting: Sports Medicine

## 2020-01-03 ENCOUNTER — Ambulatory Visit: Payer: 59 | Admitting: Sports Medicine

## 2020-01-28 ENCOUNTER — Other Ambulatory Visit: Payer: Self-pay | Admitting: Family Medicine

## 2020-04-02 ENCOUNTER — Telehealth (INDEPENDENT_AMBULATORY_CARE_PROVIDER_SITE_OTHER): Payer: 59 | Admitting: Nurse Practitioner

## 2020-04-02 ENCOUNTER — Encounter: Payer: Self-pay | Admitting: Nurse Practitioner

## 2020-04-02 DIAGNOSIS — Z5329 Procedure and treatment not carried out because of patient's decision for other reasons: Secondary | ICD-10-CM

## 2020-04-02 DIAGNOSIS — Z91199 Patient's noncompliance with other medical treatment and regimen due to unspecified reason: Secondary | ICD-10-CM

## 2020-04-02 NOTE — Patient Instructions (Addendum)
Hi [name] [name], I tried calling and texting the number we have on file to contact you this morning for your appointment and was unable to reach you.   I recommend that you have COVID-19 testing based on the symptoms that were described to the nurse. Below are some helpful links to find testing sites near you.   Please let us know of the results and continue to quarantine until you have a negative test result or until you are fever free with improvement of symptoms over 24 hours and you have been 10 days from the first day of symptoms.   If you need a work note, you can send me a MyChart message through the portal and I will be happy to detail the recommended quarantine time frame for you. Please be sure to let us know the results of your COVID testing.   You can find COVID-19 testing locations in your area through the following links:  [email or web address]  Text COVID to [phone]  [email or web address]  [email or web address] or call [phone]   Additional testing locations are scattered throughout the community at [place], [place], and other health centers.   Due to the incredibly high volume of symptomatic patients in our community, many testing centers are overwhelmed and same day testing may not be available. It is very important that you quarantine while you are having symptoms and until you are tested and your test results have returned.   Over the counter medications that may be helpful for symptoms: . Guaifenesin 1200 mg extended release tabs twice daily, with plenty of water o For cough and congestion o Brand name: Mucinex  . Pseudoephedrine 30 mg, one or two tabs every 4 to 6 hours o For sinus congestion o Brand name: Sudafed o You must get this from the pharmacy counter.  o Mucinex D is a formulation of Mucinex with Sudafed in it, this must be purchased from behind the pharmacy  counter . Oxymetazoline nasal spray each morning, one spray in each nostril, for NO MORE THAN 3 days  o For nasal and sinus congestion o Brand name: Afrin . Saline nasal spray or Saline Nasal Irrigation 3-5 times a day o For nasal and sinus congestion o Brand names: [place] or AYR . Fluticasone nasal spray, one spray in each nostril, each morning after oxymetazoline and saline, if used o For nasal and sinus congestion o Brand name: Flonase . Warm salt water gargles  o For sore throat o Every few hours as needed . Alternate ibuprofen 400-600 mg and acetaminophen 1000 mg every 4-6 hours o For fever, body aches, headache o Brand names: Motrin or Advil and Tylenol . Dextromethorphan 12-hour cough version 30 mg every 12 hours  o For cough o Brand name: Delsym Stop all other cold medications for now (Nyquil, Dayquil, Tylenol Cold, Theraflu, etc) and other non-prescription cough/cold preparations. Many of these have the same ingredients listed above and could cause an overdose of medication.   General Instructions . Allow your body to rest . Drink PLENTY of fluids . Isolate yourself from everyone, even family, until test results have returned  If your COVID-19 test is positive . Then you ARE INFECTED and you can pass the virus to others . You must quarantine from others for a minimum of  o 10 days since symptoms started AND o You are fever free for 24 hours WITHOUT any medication to reduce fever AND o Your symptoms are improving . Do not go to the store or other public areas .  Do not go around household members who are not known to be infected with COVID-19 . If you MUST leave you area of quarantine (example: go to a bathroom you share with others in your home), you must o Wear a mask o Wash your hands thoroughly o Wipe down any surfaces you touch . Do not share food, drinks, towels, or other items with other persons . Dispose of your own tissues, food containers, etc  Once you have  recovered, please continue good preventive care measures, including:  . wearing a mask when in public . wash your hands frequently . avoid touching your face/nose/eyes . cover coughs/sneezes with the inside of your elbow . stay out of crowds . keep a 6 foot distance from others  Go to the nearest hospital emergency room if fever/cough/breathlessness are severe or illness seems like a threat to life.

## 2020-04-02 NOTE — Progress Notes (Signed)
Virtual Video Visit via MyChart Note I connected with  [name] [name] on 04/02/20 at  8:50 AM EST by the video enabled telemedicine application for , MyChart, and verified that I am speaking with the correct person using two identifiers.   I introduced myself as a Designer, jewellery with the practice. We discussed the limitations of evaluation and management by telemedicine and the availability of in person appointments. The patient expressed understanding and agreed to proceed.  Participating parties in this visit include: No show for patient.   The patient is: Unknown I am: In the office  Subjective:    CC:  Chief Complaint  Patient presents with  . URI    Onset 6 days ago, BA, feverish, HA, lethargy, depressed, runny nose, cough, has been taking Tylenol with minimal relief, hx of COVID in [name] [name] 2021 and sx are the same, has not been tested for COVID, has been out of work since 03/28/2020 and needs a note to return to work    HPI: Attempted contact with patient through [place] through text invitation at 0850. Telephone call with no answer and voicemail left for patient at [phone] and again at 0905.   Past medical history, Surgical history, Family history not pertinant except as noted below, Social history, Allergies, and medications have been entered into the medical record, reviewed, and corrections made.   Review of Systems:  All review of systems negative except what is listed in the HPI   Objective:    General:  UTA- no show for visit   Impression and Recommendations:    There are no diagnoses linked to this encounter. 1. No-show for appointment Unable to reach patient by [place] invitation or phone call x2. Voicemail left AVS details testing recommendations and OTC treatment options that may be helpful for symptom management. Recommended quarantine information provided.  Will be happy to write work note that patient is being tested and recommendations for quarantine.    Follow-up if symptoms worsen or fail to improve.     I provided 20 minutes of non-face-to-face interaction with this [name] [name] visit including intake, same-day documentation, and chart review.   Orma Render, NP

## 2020-04-03 ENCOUNTER — Encounter: Payer: Self-pay | Admitting: Nurse Practitioner

## 2020-04-04 ENCOUNTER — Telehealth: Payer: Self-pay | Admitting: Nurse Practitioner

## 2020-04-04 NOTE — Telephone Encounter (Signed)
Work note provided for patient for missed work 01/05-01/09/2020 due to illness. Negative home COVID test.

## 2020-04-27 ENCOUNTER — Telehealth (INDEPENDENT_AMBULATORY_CARE_PROVIDER_SITE_OTHER): Payer: 59 | Admitting: Family Medicine

## 2020-04-27 ENCOUNTER — Encounter: Payer: Self-pay | Admitting: Family Medicine

## 2020-04-27 DIAGNOSIS — U071 COVID-19: Secondary | ICD-10-CM | POA: Diagnosis not present

## 2020-04-27 MED ORDER — HYDROCOD POLST-CPM POLST ER 10-8 MG/5ML PO SUER
5.0000 mL | Freq: Every evening | ORAL | 0 refills | Status: DC | PRN
Start: 2020-04-27 — End: 2020-05-11

## 2020-04-27 MED ORDER — BENZONATATE 200 MG PO CAPS: 200.0000 mg | ORAL_CAPSULE | Freq: Two times a day (BID) | ORAL | 1 refills | Status: DC | PRN

## 2020-04-27 NOTE — Progress Notes (Signed)
[name] [name] - 50 y.o. male MRN [id number]  Date of birth: 1970/07/22   This visit type was conducted due to national recommendations for restrictions regarding the COVID-19 Pandemic (e.g. social distancing).  This format is felt to be most appropriate for this patient at this time.  All issues noted in this document were discussed and addressed.  No physical exam was performed (except for noted visual exam findings with Video Visits).  I discussed the limitations of evaluation and management by telemedicine and the availability of in person appointments. The patient expressed understanding and agreed to proceed.  I connected with@ on 04/27/20 at  8:10 AM EST by a video enabled telemedicine application and verified that I am speaking with the correct person using two identifiers.  Present at visit: Luetta Nutting, DO Ricka Burdock   Patient Location: Home [address] [address]   Provider location:   [facility]  Chief Complaint  Patient presents with  . Covid Positive    HPI  [name] [name] is a 50 y.o. male who presents via audio/video conferencing for a telehealth visit today.  Onset of cough, congestion, fever, aches, headache 1/29.  Tested positive for COVID on the evening 1/29.  He has had shortness of breath but this has improved over the past 36 hours.  He is afebrile now. He continues to have cough, having difficulty sleeping at night.  No previous vaccine for COVID. He plans to remain in isolation until 2/8.      ROS:  A comprehensive ROS was completed and negative except as noted per HPI  Past Medical History:  Diagnosis Date  . Allergy   . Barrett's esophagus determined by biopsy   . Chronic sinusitis   . Gallbladder polyp 09/21/2018   4 mm  . GERD (gastroesophageal reflux disease)   . Helicobacter pylori gastritis   . Hiatal hernia   . Mixed hyperlipidemia 03/11/2018    Past Surgical History:  Procedure Laterality Date  . SINUS IRRIGATION    . UPPER  GI ENDOSCOPY      Family History  Problem Relation Age of Onset  . Hypertension Father   . Throat cancer Father   . Esophageal cancer Father   . Stomach cancer Brother   . Colon cancer Paternal Aunt   . Rectal cancer Neg Hx   . Colon polyps Neg Hx     Social History   Socioeconomic History  . Marital status: Single    Spouse name: Not on file  . Number of children: 4  . Years of education: Not on file  . Highest education level: Not on file  Occupational History  . Occupation: Curator  Tobacco Use  . Smoking status: Never Smoker  . Smokeless tobacco: Never Used  Vaping Use  . Vaping Use: Never used  Substance and Sexual Activity  . Alcohol use: Yes    Alcohol/week: 14.0 standard drinks    Types: 14 Cans of beer per week  . Drug use: No  . Sexual activity: Yes    Birth control/protection: None  Other Topics Concern  . Not on file  Social History Narrative  . Not on file   Social Determinants of Health   Financial Resource Strain: Not on file  Food Insecurity: Not on file  Transportation Needs: Not on file  Physical Activity: Not on file  Stress: Not on file  Social Connections: Not on file  Intimate Partner Violence: Not on file     Current Outpatient  Medications:  .  benzonatate (TESSALON) 200 MG capsule, Take 1 capsule (200 mg total) by mouth 2 (two) times daily as needed for cough., Disp: 30 capsule, Rfl: 1 .  chlorpheniramine-HYDROcodone (TUSSIONEX PENNKINETIC ER) 10-8 MG/5ML SUER, Take 5 mLs by mouth at bedtime as needed for cough., Disp: 140 mL, Rfl: 0 .  fexofenadine (ALLEGRA) 180 MG tablet, Take 1 tablet (180 mg total) by mouth at bedtime., Disp: 90 tablet, Rfl: 3 .  fluticasone (FLONASE) 50 MCG/ACT nasal spray, PLACE 1 SPRAY INTO BOTH NOSTRILS DAILY., Disp: 48 mL, Rfl: 1 .  omeprazole (PRILOSEC) 40 MG capsule, TAKE 1 CAPSULE(40 MG) BY MOUTH IN THE MORNING AND AT BEDTIME, Disp: 180 capsule, Rfl: 0 .  rosuvastatin (CRESTOR) 5 MG tablet, Take 1 tablet  (5 mg total) by mouth daily., Disp: 90 tablet, Rfl: 3  EXAM:  VITALS per patient if applicable: Wt 193 lb (87.5 kg)   BMI 28.50 kg/m   GENERAL: alert, oriented, appears well and in no acute distress  HEENT: atraumatic, conjunttiva clear, no obvious abnormalities on inspection of external nose and ears  NECK: normal movements of the head and neck  LUNGS: on inspection no signs of respiratory distress, breathing rate appears normal, no obvious gross SOB, gasping or wheezing  CV: no obvious cyanosis  MS: moves all visible extremities without noticeable abnormality  PSYCH/NEURO: pleasant and cooperative, no obvious depression or anxiety, speech and thought processing grossly intact  ASSESSMENT AND PLAN:  Discussed the following assessment and plan:  COVID-19 He has symptoms consistent with COVID as well as multiple positive rapid tests.   Symptoms improving however he continues to have cough.  Will add tussionex qhs and tessalon during the day.  Recommend increased fluid intake. Instructed that if symptoms worsen again let us know.  Aware to seek emergency care for difficulty breathing and/or worsening lethargy.       I discussed the assessment and treatment plan with the patient. The patient was provided an opportunity to ask questions and all were answered. The patient agreed with the plan and demonstrated an understanding of the instructions.   The patient was advised to call back or seek an in-person evaluation if the symptoms worsen or if the condition fails to improve as anticipated.    Luetta Nutting, DO

## 2020-04-27 NOTE — Progress Notes (Signed)
Symptoms started 04/21/20  Symptoms cough and nasal congestion.

## 2020-04-27 NOTE — Assessment & Plan Note (Signed)
He has symptoms consistent with COVID as well as multiple positive rapid tests.   Symptoms improving however he continues to have cough.  Will add tussionex qhs and tessalon during the day.  Recommend increased fluid intake. Instructed that if symptoms worsen again let us know.  Aware to seek emergency care for difficulty breathing and/or worsening lethargy.

## 2020-05-05 ENCOUNTER — Other Ambulatory Visit: Payer: Self-pay | Admitting: Family Medicine

## 2020-05-07 ENCOUNTER — Other Ambulatory Visit: Payer: Self-pay

## 2020-05-07 DIAGNOSIS — K2100 Gastro-esophageal reflux disease with esophagitis, without bleeding: Secondary | ICD-10-CM

## 2020-05-07 MED ORDER — OMEPRAZOLE 40 MG PO CPDR: DELAYED_RELEASE_CAPSULE | ORAL | 0 refills | Status: DC

## 2020-05-08 ENCOUNTER — Encounter: Payer: Self-pay | Admitting: Family Medicine

## 2020-05-08 ENCOUNTER — Ambulatory Visit: Payer: 59 | Admitting: Family Medicine

## 2020-05-08 ENCOUNTER — Other Ambulatory Visit: Payer: Self-pay

## 2020-05-08 VITALS — BP 147/90 | HR 100 | Temp 98.0°F | Wt 195.9 lb

## 2020-05-08 DIAGNOSIS — J0100 Acute maxillary sinusitis, unspecified: Secondary | ICD-10-CM | POA: Diagnosis not present

## 2020-05-08 DIAGNOSIS — R002 Palpitations: Secondary | ICD-10-CM

## 2020-05-08 DIAGNOSIS — J329 Chronic sinusitis, unspecified: Secondary | ICD-10-CM | POA: Insufficient documentation

## 2020-05-08 DIAGNOSIS — R221 Localized swelling, mass and lump, neck: Secondary | ICD-10-CM | POA: Diagnosis not present

## 2020-05-08 MED ORDER — AMOXICILLIN-POT CLAVULANATE 875-125 MG PO TABS: 1.0000 | ORAL_TABLET | Freq: Two times a day (BID) | ORAL | 0 refills | Status: DC

## 2020-05-08 NOTE — Assessment & Plan Note (Addendum)
Doesn't really appear to be enlarged salivary gland, appearance is more consistent with enlarged lymph node or soft tissue mass such as lipoma.  Will see if this improves with augmentin prescribed for sinus infection.  [place] of neck ordered, may need to get CT for better clarification if [place] is inconclusive.

## 2020-05-08 NOTE — Assessment & Plan Note (Addendum)
Chronic, intermittent episodes.   EKG with NSR, unchanged from previous tracings.   2D echo ordered.

## 2020-05-08 NOTE — Progress Notes (Signed)
[name] [name] - 50 y.o. male MRN [id number]  Date of birth: [birth year]  Subjective Chief Complaint  Patient presents with  . Palpitations  . Mass    HPI [name] [name] is a 50 y.o. male here today with complaint of mass that seems to be getting larger along the R side of his neck/jaw.  First noticed a few months ago and relatively small then.  Area is not really tender.  Denies ear or jaw pain.  He does think he may have a sinus infection. He is having sinus pain and drainage for several days.  No fever, chills, sore throat or difficulty swallowing.   He also continues to have intermittent palpitations.  EKG's and labs have been unremarkable.  Denies dizziness or shortness of breath.  Holter ordered previously but never completed.    ROS:  A comprehensive ROS was completed and negative except as noted per HPI  No Known Allergies  Past Medical History:  Diagnosis Date  . Allergy   . Barrett's esophagus determined by biopsy   . Chronic sinusitis   . Gallbladder polyp 09/21/2018   4 mm  . GERD (gastroesophageal reflux disease)   . Helicobacter pylori gastritis   . Hiatal hernia   . Mixed hyperlipidemia 03/11/2018    Past Surgical History:  Procedure Laterality Date  . SINUS IRRIGATION    . UPPER GI ENDOSCOPY      Social History   Socioeconomic History  . Marital status: Single    Spouse name: Not on file  . Number of children: 4  . Years of education: Not on file  . Highest education level: Not on file  Occupational History  . Occupation: Curator  Tobacco Use  . Smoking status: Never Smoker  . Smokeless tobacco: Never Used  Vaping Use  . Vaping Use: Never used  Substance and Sexual Activity  . Alcohol use: Yes    Alcohol/week: 14.0 standard drinks    Types: 14 Cans of beer per week  . Drug use: No  . Sexual activity: Yes    Birth control/protection: None  Other Topics Concern  . Not on file  Social History Narrative  . Not on file   Social Determinants of  Health   Financial Resource Strain: Not on file  Food Insecurity: Not on file  Transportation Needs: Not on file  Physical Activity: Not on file  Stress: Not on file  Social Connections: Not on file    Family History  Problem Relation Age of Onset  . Hypertension Father   . Throat cancer Father   . Esophageal cancer Father   . Stomach cancer Brother   . Colon cancer Paternal Aunt   . Rectal cancer Neg Hx   . Colon polyps Neg Hx     Health Maintenance  Topic Date Due  . COVID-19 Vaccine (1) Never done  . TETANUS/TDAP  Never done  . INFLUENZA VACCINE  06/21/2020 (Originally 10/23/2019)  . Hepatitis C Screening  04/02/2021 (Originally [birth year])  . HIV Screening  04/02/2021 (Originally 01/17/1988)  . COLONOSCOPY (Pts 45-20yrs Insurance coverage will need to be confirmed)  11/14/2021     ----------------------------------------------------------------------------------------------------------------------------------------------------------------------------------------------------------------- Physical Exam BP (!) 147/90 (BP Location: Left Arm, Patient Position: Sitting, Cuff Size: Normal)   Pulse 100   Temp 98 F (36.7 C)   Wt 195 lb 14.4 oz (88.9 kg)   SpO2 96%   BMI 28.93 kg/m   Physical Exam Constitutional:      Appearance: Normal appearance.  Eyes:  General: No scleral icterus. Neck:     Comments: Non tender mass on R neck just below angle of mandible and extending to under earlobe.   Cardiovascular:     Rate and Rhythm: Normal rate and regular rhythm.  Pulmonary:     Effort: Pulmonary effort is normal.     Breath sounds: Normal breath sounds.  Neurological:     General: No focal deficit present.     Mental Status: He is alert.  Psychiatric:        Mood and Affect: Mood normal.        Behavior: Behavior normal.     EKG: NSR w/ rate 95BPM.  Normal axis, PR and QTc.   ------------------------------------------------------------------------------------------------------------------------------------------------------------------------------------------------------------------- Assessment and Plan  Neck mass Doesn't really appear to be enlarged salivary gland, appearance is more consistent with enlarged lymph node or soft tissue mass such as lipoma.  Will see if this improves with augmentin prescribed for sinus infection.  [place] of neck ordered, may need to get CT for better clarification if [place] is inconclusive.    Palpitations Chronic, intermittent episodes.   EKG with NSR, unchanged from previous tracings.   2D echo ordered.   Sinusitis Symptoms consistent with acute sinusitis.  Start augmentin Recommend supportive care at home with increased fluids as well.     Meds ordered this encounter  Medications  . amoxicillin-clavulanate (AUGMENTIN) 875-125 MG tablet    Sig: Take 1 tablet by mouth 2 (two) times daily.    Dispense:  20 tablet    Refill:  0    No follow-ups on file.    This visit occurred during the SARS-CoV-2 public health emergency.  Safety protocols were in place, including screening questions prior to the visit, additional usage of staff PPE, and extensive cleaning of exam room while observing appropriate contact time as indicated for disinfecting solutions.

## 2020-05-08 NOTE — Assessment & Plan Note (Signed)
Symptoms consistent with acute sinusitis.  Start augmentin Recommend supportive care at home with increased fluids as well.

## 2020-05-08 NOTE — Patient Instructions (Signed)
I have ordered ultrasound of the area on the neck/jaw and heart.   Start augmentin for sinus infection.

## 2020-05-10 ENCOUNTER — Ambulatory Visit: Payer: 59 | Admitting: Cardiology

## 2020-05-11 ENCOUNTER — Ambulatory Visit: Payer: 59 | Admitting: Cardiology

## 2020-05-11 ENCOUNTER — Other Ambulatory Visit: Payer: Self-pay

## 2020-05-11 ENCOUNTER — Ambulatory Visit (INDEPENDENT_AMBULATORY_CARE_PROVIDER_SITE_OTHER): Payer: 59

## 2020-05-11 ENCOUNTER — Encounter: Payer: Self-pay | Admitting: Cardiology

## 2020-05-11 VITALS — BP 140/90 | HR 71 | Ht 69.0 in | Wt 196.0 lb

## 2020-05-11 DIAGNOSIS — R002 Palpitations: Secondary | ICD-10-CM

## 2020-05-11 DIAGNOSIS — E785 Hyperlipidemia, unspecified: Secondary | ICD-10-CM

## 2020-05-11 DIAGNOSIS — R0602 Shortness of breath: Secondary | ICD-10-CM

## 2020-05-11 DIAGNOSIS — R221 Localized swelling, mass and lump, neck: Secondary | ICD-10-CM

## 2020-05-11 MED ORDER — PROPRANOLOL HCL 20 MG PO TABS: 20.0000 mg | ORAL_TABLET | Freq: Every evening | ORAL | 3 refills | Status: DC

## 2020-05-11 NOTE — Progress Notes (Signed)
Cardiology Office Note:    Date:  05/11/2020   ID:  [name] [name], DOB 12/18/1970, MRN [id number]  PCP:  Luetta Nutting, DO  Cardiologist:  Berniece Salines, DO  Electrophysiologist:  None   Referring MD: Luetta Nutting, DO   Chief Complaint  Patient presents with  . Annual Exam    History of Present Illness:    [name] [name] is a 50 y.o. male with a hx of allergic rhinitis, anxiety, Barrett's esophagus, chronic sinusitis GERD and hyperlipidemia is here today for a follow-up visit.  The patient was last seen in our practice in November 2020 at that time he was seen by Laurann Montana NP due to palpitations.  At the end of the visit a monitor was placed on the patient which he tells me he wore but just did not send back.  He does not know where it is now.  In the interim he tells me he is try to cut back on caffeine and as well reduce stress in his life.  He notes that he recently has been experiencing some fluttering sensation.  In addition he has had some intermittent midsternal sharp chest pain which he says usually only happens when he is anxious.  No shortness of breath no lightheadedness no dizziness.   Past Medical History:  Diagnosis Date  . Allergy   . Barrett's esophagus determined by biopsy   . Chronic sinusitis   . Gallbladder polyp 09/21/2018   4 mm  . GERD (gastroesophageal reflux disease)   . Helicobacter pylori gastritis   . Hiatal hernia   . Mixed hyperlipidemia 03/11/2018    Past Surgical History:  Procedure Laterality Date  . SINUS IRRIGATION    . UPPER GI ENDOSCOPY      Current Medications: Current Meds  Medication Sig  . amoxicillin-clavulanate (AUGMENTIN) 875-125 MG tablet Take 1 tablet by mouth 2 (two) times daily.  . benzonatate (TESSALON) 200 MG capsule Take 1 capsule (200 mg total) by mouth 2 (two) times daily as needed for cough.  . fexofenadine (ALLEGRA) 180 MG tablet Take 1 tablet (180 mg total) by mouth at bedtime.  . fluticasone (FLONASE) 50  MCG/ACT nasal spray PLACE 1 SPRAY INTO BOTH NOSTRILS DAILY.  Marland Kitchen omeprazole (PRILOSEC) 40 MG capsule TAKE 1 CAPSULE(40 MG) BY MOUTH IN THE MORNING AND AT BEDTIME  . propranolol (INDERAL) 20 MG tablet Take 1 tablet (20 mg total) by mouth at bedtime.  . rosuvastatin (CRESTOR) 5 MG tablet Take 1 tablet (5 mg total) by mouth daily.     Allergies:   Patient has no known allergies.   Social History   Socioeconomic History  . Marital status: Single    Spouse name: Not on file  . Number of children: 4  . Years of education: Not on file  . Highest education level: Not on file  Occupational History  . Occupation: Curator  Tobacco Use  . Smoking status: Never Smoker  . Smokeless tobacco: Never Used  Vaping Use  . Vaping Use: Never used  Substance and Sexual Activity  . Alcohol use: Yes    Alcohol/week: 14.0 standard drinks    Types: 14 Cans of beer per week  . Drug use: No  . Sexual activity: Yes    Birth control/protection: None  Other Topics Concern  . Not on file  Social History Narrative  . Not on file   Social Determinants of Health   Financial Resource Strain: Not on file  Food Insecurity: Not on file  Transportation Needs: Not on file  Physical Activity: Not on file  Stress: Not on file  Social Connections: Not on file     Family History: The patient's family history includes Colon cancer in his paternal aunt; Esophageal cancer in his father; Hypertension in his father; Stomach cancer in his brother; Throat cancer in his father. There is no history of Rectal cancer or Colon polyps.  ROS:   Review of Systems  Constitution: Negative for decreased appetite, fever and weight gain.  HENT: Negative for congestion, ear discharge, hoarse voice and sore throat.   Eyes: Negative for discharge, redness, vision loss in right eye and visual halos.  Cardiovascular: Reports palpitations and chest pain.  Negative for dyspnea on exertion, leg swelling, orthopnea. Respiratory: Negative  for cough, hemoptysis, shortness of breath and snoring.   Endocrine: Negative for heat intolerance and polyphagia.  Hematologic/Lymphatic: Negative for bleeding problem. Does not bruise/bleed easily.  Skin: Negative for flushing, nail changes, rash and suspicious lesions.  Musculoskeletal: Negative for arthritis, joint pain, muscle cramps, myalgias, neck pain and stiffness.  Gastrointestinal: Negative for abdominal pain, bowel incontinence, diarrhea and excessive appetite.  Genitourinary: Negative for decreased libido, genital sores and incomplete emptying.  Neurological: Negative for brief paralysis, focal weakness, headaches and loss of balance.  Psychiatric/Behavioral: Negative for altered mental status, depression and suicidal ideas.  Allergic/Immunologic: Negative for HIV exposure and persistent infections.    EKGs/Labs/Other Studies Reviewed:    The following studies were reviewed today:   EKG:  The ekg ordered today demonstrates sinus rhythm heart rate 71 bpm with arrhythmia.  Recent Labs: 10/26/2019: ALT 22; BUN 9; Creat 1.06; Hemoglobin 16.4; Platelets 290; Potassium 4.0; Sodium 141  Recent Lipid Panel    Component Value Date/Time   CHOL 317 (H) 10/26/2019 0953   CHOL 331 (H) 02/21/2019 1510   TRIG 377 (H) 10/26/2019 0953   HDL 33 (L) 10/26/2019 0953   HDL 40 02/21/2019 1510   CHOLHDL 9.6 (H) 10/26/2019 0953   LDLCALC 220 (H) 10/26/2019 0953   LDLDIRECT 257 (H) 02/21/2019 1510    Physical Exam:    VS:  BP 140/90 (BP Location: Left Arm, Patient Position: Sitting, Cuff Size: Normal)   Pulse 71   Ht 5\' 9"  (1.753 m)   Wt 196 lb (88.9 kg)   SpO2 96%   BMI 28.94 kg/m     Wt Readings from Last 3 Encounters:  05/11/20 196 lb (88.9 kg)  05/08/20 195 lb 14.4 oz (88.9 kg)  04/27/20 193 lb (87.5 kg)     GEN: Well nourished, well developed in no acute distress HEENT: Normal NECK: No JVD; No carotid bruits LYMPHATICS: No lymphadenopathy CARDIAC: S1S2 noted,RRR, no  murmurs, rubs, gallops RESPIRATORY:  Clear to auscultation without rales, wheezing or rhonchi  ABDOMEN: Soft, non-tender, non-distended, +bowel sounds, no guarding. EXTREMITIES: No edema, No cyanosis, no clubbing MUSCULOSKELETAL:  No deformity  SKIN: Warm and dry NEUROLOGIC:  Alert and oriented x 3, non-focal PSYCHIATRIC:  Normal affect, good insight  ASSESSMENT:    1. Shortness of breath   2. Hyperlipidemia, unspecified hyperlipidemia type   3. Palpitations    PLAN:    For shortness of breath which is also associated with his palpitations he is scheduled for an echocardiogram coming up on June 07, 2020.  We will review this and see there is any structural abnormalities.  In the meantime given the fact that the palpitations also associated with anxiety I started patient on propanolol 20 mg daily.  We  will see if this medication help his symptoms.  We also have discussed cutting back on energy drinks and caffeinated drinks as well he is, trying to do his best to cut these out.  I also encouraged the patient to get Kardiamobile to help with capturing his heart rate and rhythm during these episodes.  Today his EKG shows evidence of sinus rhythm with arrhythmia.  Explained to him what it means to have sinus arrhythmia.  His most recent lipid profile which was done in November 2020 showed total cholesterol 331, triglyceride 215, HDL 40, LDL 247.  He was started on Crestor at that time but unfortunately patient had not taken his medication.,  I explained to the patient he really should be on a high intensity statin.  By the time he preferred to start with the previously prescribed Crestor 5 mg.  I also recommended a coronary calcium scoring but he will think about this for now.  His blood pressure is elevated he should be less than 130/80 mmHg.  He is going to try diet modification and will discuss in more details but since he has been started on the propanolol 20 mg daily should also help.   The  patient is in agreement with the above plan. The patient left the office in stable condition.  The patient will follow up in   Medication Adjustments/Labs and Tests Ordered: Current medicines are reviewed at length with the patient today.  Concerns regarding medicines are outlined above.  Orders Placed This Encounter  Procedures  . Basic metabolic panel  . Magnesium  . Lipid panel  . TSH  . EKG 12-Lead   Meds ordered this encounter  Medications  . propranolol (INDERAL) 20 MG tablet    Sig: Take 1 tablet (20 mg total) by mouth at bedtime.    Dispense:  90 tablet    Refill:  3    Patient Instructions  Medication Instructions:  Your physician has recommended you make the following change in your medication:  START: Propranolol 20 mg at night  *If you need a refill on your cardiac medications before your next appointment, please call your pharmacy*   Lab Work: Your physician recommends that you return for lab work: BMET, Mag, TSH, Lipids  If you have labs (blood work) drawn today and your tests are completely normal, you will receive your results only by: Marland Kitchen MyChart Message (if you have MyChart) OR . A paper copy in the mail If you have any lab test that is abnormal or we need to change your treatment, we will call you to review the results.   Testing/Procedures: None   Follow-Up: At [facility], you and your health needs are our priority.  As part of our continuing mission to provide you with exceptional heart care, we have created designated Provider Care Teams.  These Care Teams include your primary Cardiologist (physician) and Advanced Practice Providers (APPs -  Physician Assistants and Nurse Practitioners) who all work together to provide you with the care you need, when you need it.  We recommend signing up for the patient portal called "MyChart".  Sign up information is provided on this After Visit Summary.  MyChart is used to connect with patients for Virtual  Visits (Telemedicine).  Patients are able to view lab/test results, encounter notes, upcoming appointments, etc.  Non-urgent messages can be sent to your provider as well.   To learn more about what you can do with MyChart, go to [email or web address].  Your next appointment:   4 week(s)  The format for your next appointment:   In Person  Provider:   Berniece Salines, DO   Other Instructions      Adopting a Healthy Lifestyle.  Know what a healthy weight is for you (roughly BMI <25) and aim to maintain this   Aim for 7+ servings of fruits and vegetables daily   65-80+ fluid ounces of water or unsweet tea for healthy kidneys   Limit to max 1 drink of alcohol per day; avoid smoking/tobacco   Limit animal fats in diet for cholesterol and heart health - choose grass fed whenever available   Avoid highly processed foods, and foods high in saturated/trans fats   Aim for low stress - take time to unwind and care for your mental health   Aim for 150 min of moderate intensity exercise weekly for heart health, and weights twice weekly for bone health   Aim for 7-9 hours of sleep daily   When it comes to diets, agreement about the perfect plan isnt easy to find, even among the experts. Experts at the [employer] developed an idea known as the Healthy Eating Plate. Just imagine a plate divided into logical, healthy portions.   The emphasis is on diet quality:   Load up on vegetables and fruits - one-half of your plate: Aim for color and variety, and remember that potatoes dont count.   Go for whole grains - one-quarter of your plate: Whole wheat, barley, wheat berries, quinoa, oats, brown rice, and foods made with them. If you want pasta, go with whole wheat pasta.   Protein power - one-quarter of your plate: Fish, chicken, beans, and nuts are all healthy, versatile protein sources. Limit red meat.   The diet, however, does go beyond the plate, offering a few  other suggestions.   Use healthy plant oils, such as olive, canola, soy, corn, sunflower and peanut. Check the labels, and avoid partially hydrogenated oil, which have unhealthy trans fats.   If youre thirsty, drink water. Coffee and tea are good in moderation, but skip sugary drinks and limit milk and dairy products to one or two daily servings.   The type of carbohydrate in the diet is more important than the amount. Some sources of carbohydrates, such as vegetables, fruits, whole grains, and beans-are healthier than others.   Finally, stay active  Signed, Berniece Salines, DO  05/11/2020 11:15 PM    [facility]

## 2020-05-11 NOTE — Patient Instructions (Signed)
Medication Instructions:  Your physician has recommended you make the following change in your medication:  START: Propranolol 20 mg at night  *If you need a refill on your cardiac medications before your next appointment, please call your pharmacy*   Lab Work: Your physician recommends that you return for lab work: BMET, Mag, TSH, Lipids  If you have labs (blood work) drawn today and your tests are completely normal, you will receive your results only by: Marland Kitchen MyChart Message (if you have MyChart) OR . A paper copy in the mail If you have any lab test that is abnormal or we need to change your treatment, we will call you to review the results.   Testing/Procedures: None   Follow-Up: At [facility], you and your health needs are our priority.  As part of our continuing mission to provide you with exceptional heart care, we have created designated Provider Care Teams.  These Care Teams include your primary Cardiologist (physician) and Advanced Practice Providers (APPs -  Physician Assistants and Nurse Practitioners) who all work together to provide you with the care you need, when you need it.  We recommend signing up for the patient portal called "MyChart".  Sign up information is provided on this After Visit Summary.  MyChart is used to connect with patients for Virtual Visits (Telemedicine).  Patients are able to view lab/test results, encounter notes, upcoming appointments, etc.  Non-urgent messages can be sent to your provider as well.   To learn more about what you can do with MyChart, go to [email or web address].    Your next appointment:   4 week(s)  The format for your next appointment:   In Person  Provider:   Berniece Salines, DO   Other Instructions

## 2020-05-16 IMAGING — US ULTRASOUND ABDOMEN COMPLETE
1 series · 13 of 25 positions shown · non-contrast
Comparison: None

CLINICAL DATA: Abdominal distension, elevated ALT,
hyperbilirubinemia, LEFT upper quadrant tenderness, history GERD

EXAM:
ABDOMEN ULTRASOUND COMPLETE

[Series 1: ultrasound abdomen complete · 0.20mm/px · 13 of 174 slices shown]
[im 1/174]
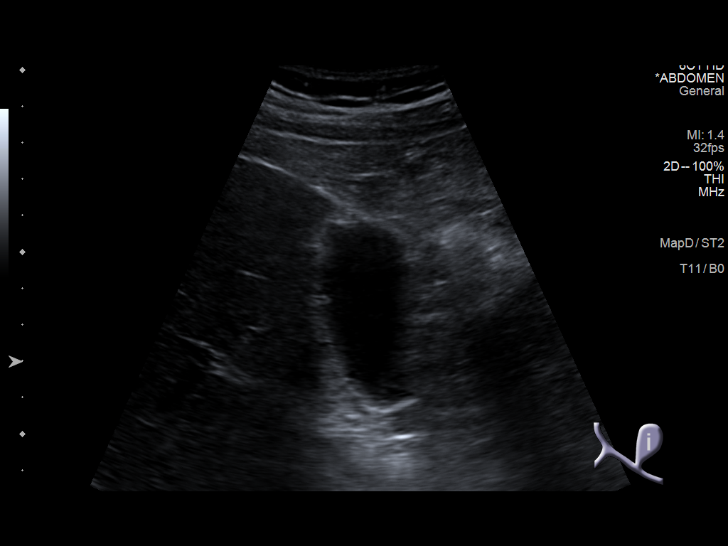
[im 15/174]
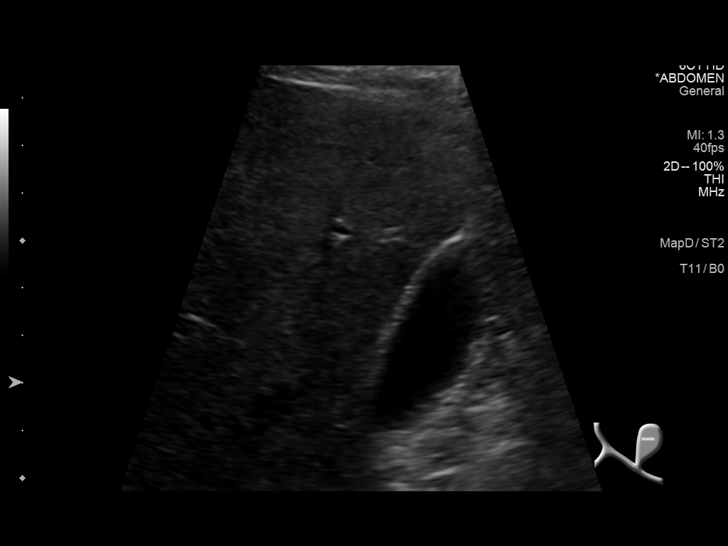
[im 29/174]
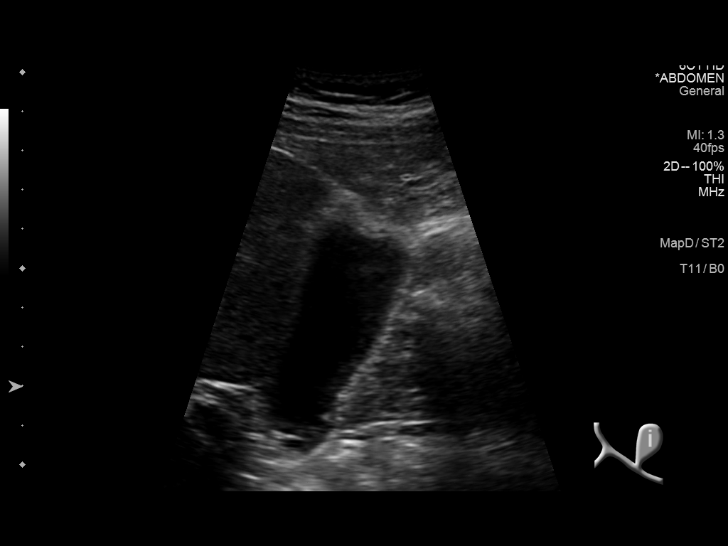
[im 44/174]
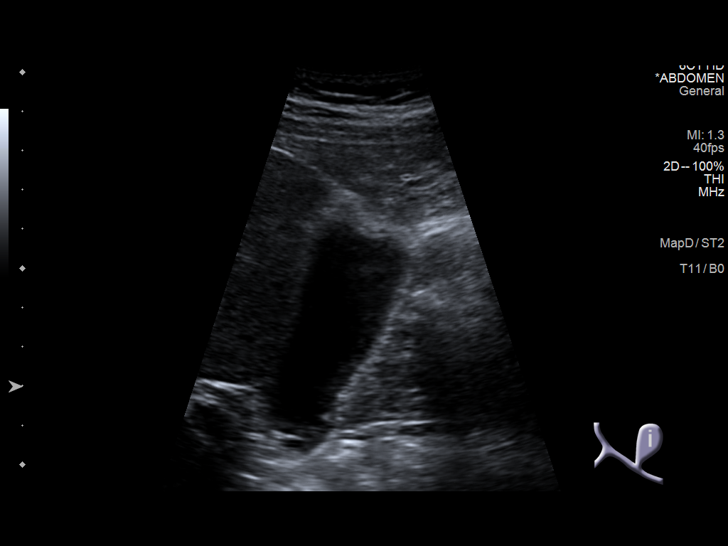
[im 58/174]
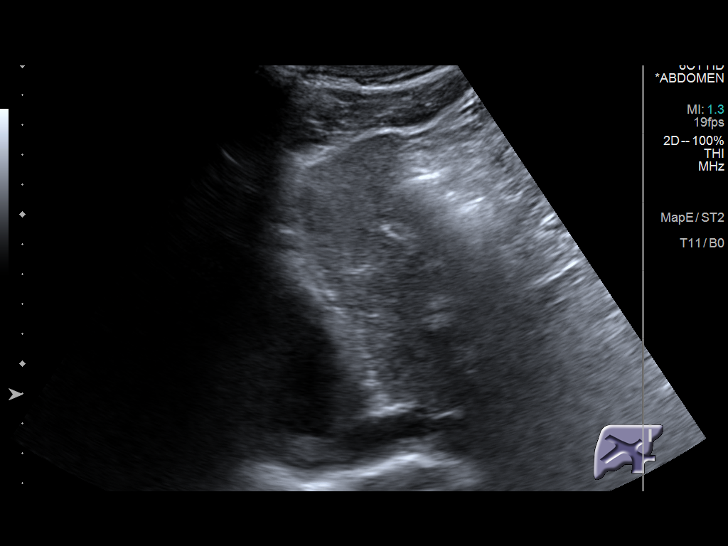
[im 73/174]
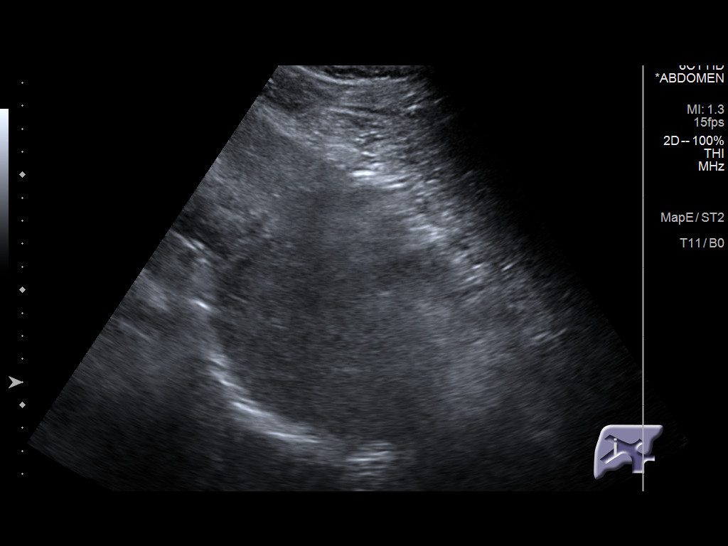
[im 87/174]
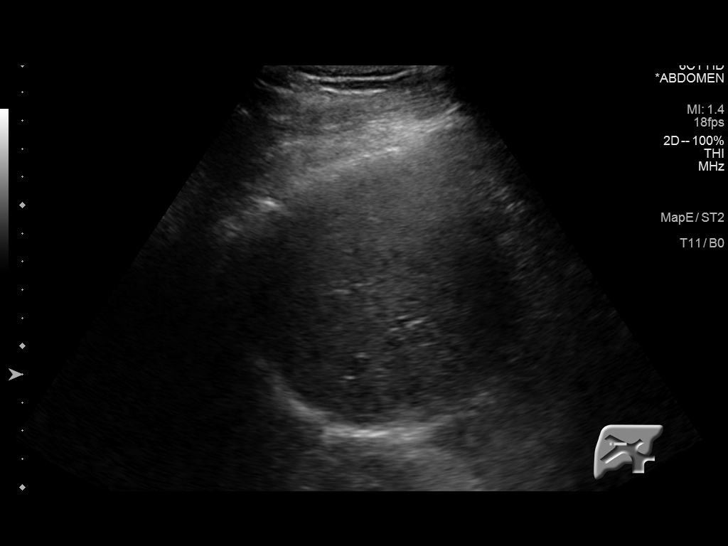
[im 101/174]
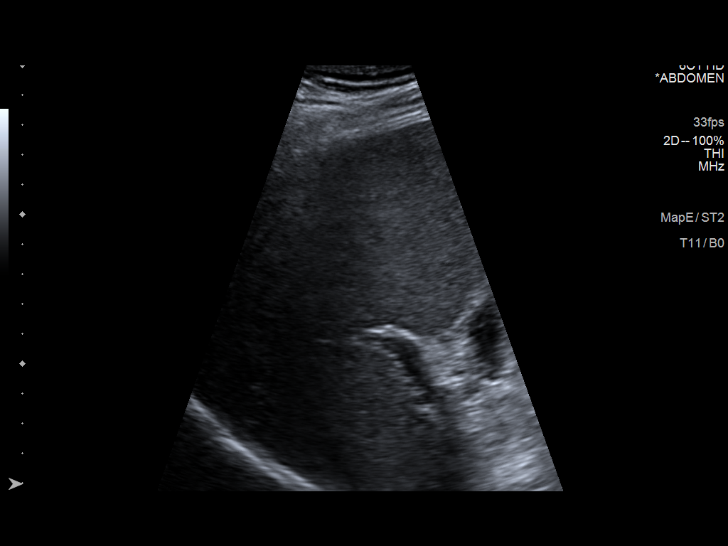
[im 116/174]
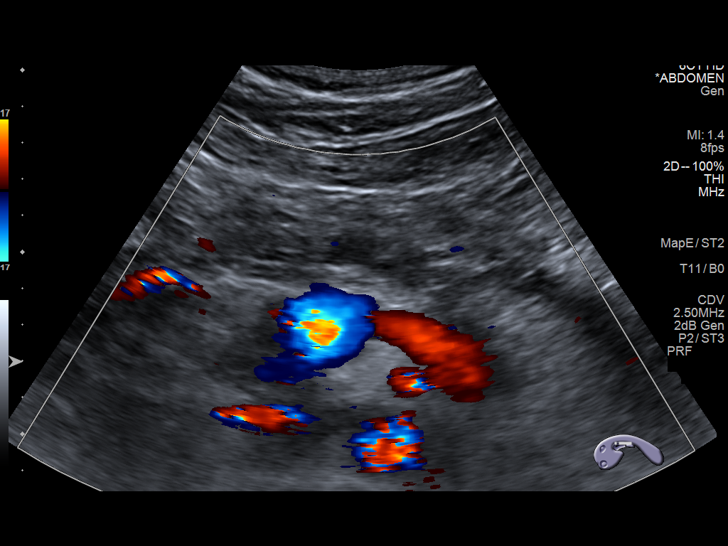
[im 130/174]
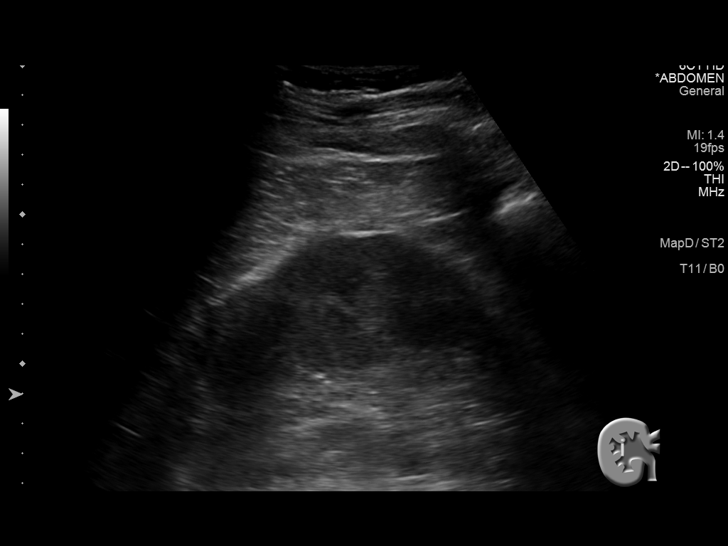
[im 145/174]
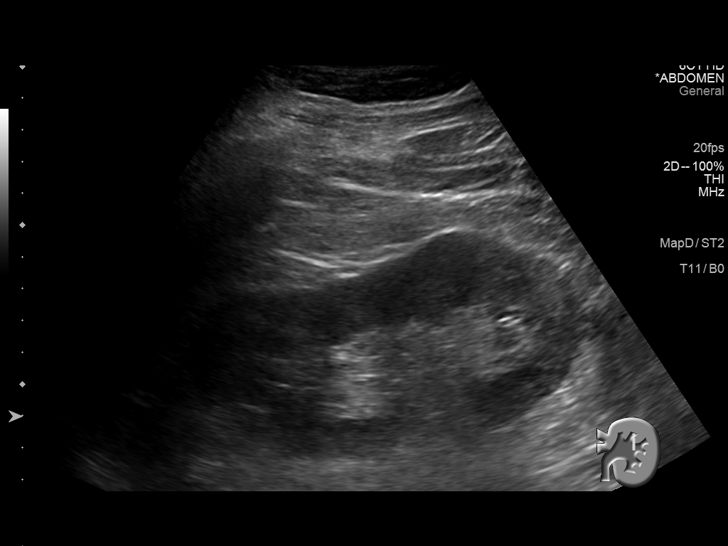
[im 159/174]
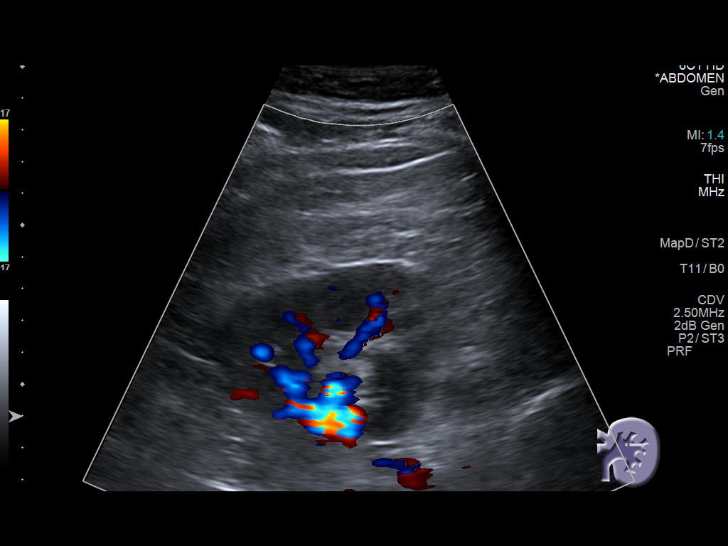
[im 174/174]
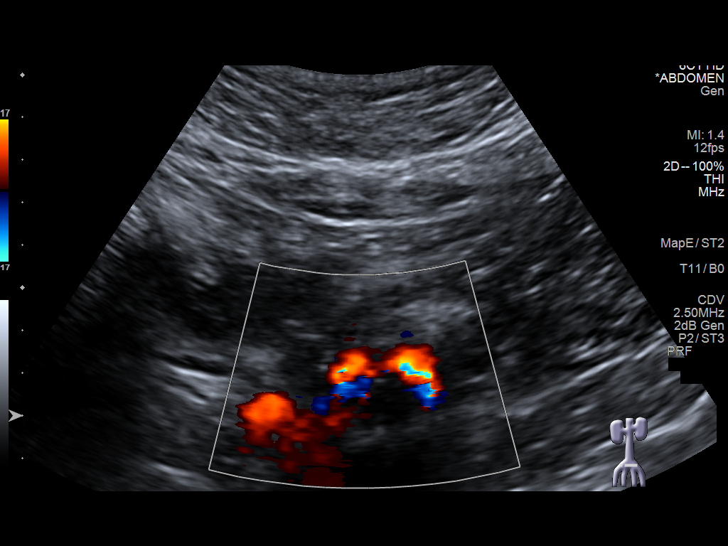

[13 of 25 positions shown; findings below may reference images not displayed]

FINDINGS: Gallbladder: Normally distended without stones or wall thickening. 4
mm diameter polyp noted. No pericholecystic fluid or sonographic
Murphy sign.

Common bile duct: Not visualized due to shadowing bowel gas at the
porta hepatis.

Liver: Normal echogenicity. No definite hepatic mass or nodularity
are identified though assessment of intrahepatic detail is limited
secondary to sound attenuation related to body habitus. No gross
intrahepatic biliary dilatation visualized. Portal vein is patent on
color Doppler imaging with normal direction of blood flow towards
the liver.

IVC: Normal appearance

Pancreas: Portions of pancreas are normal in appearance with
additional portions obscured by bowel gas.

Spleen: Normal appearance, 9.4 cm length

Right Kidney: Length: 10.9 cm. Normal morphology without mass or
hydronephrosis.

Left Kidney: Length: 11.7 cm. Normal morphology without mass or
hydronephrosis.

Abdominal aorta: Normal caliber

Other findings: No free fluid
IMPRESSION: Limitations of exam secondary to bowel gas and body habitus.

Small 4 mm gallbladder polyp noted.

No definite acute hepatobiliary abnormalities are identified within
limitations of exam; if patient has persistent abnormal LFTs,
consider further imaging by CT or MR.

## 2020-06-07 ENCOUNTER — Ambulatory Visit (HOSPITAL_COMMUNITY): Payer: 59

## 2020-06-13 ENCOUNTER — Ambulatory Visit: Payer: 59 | Admitting: Cardiology

## 2020-06-28 ENCOUNTER — Ambulatory Visit (HOSPITAL_COMMUNITY): Payer: 59 | Attending: Family Medicine

## 2020-06-28 ENCOUNTER — Other Ambulatory Visit: Payer: Self-pay

## 2020-06-28 DIAGNOSIS — R002 Palpitations: Secondary | ICD-10-CM | POA: Insufficient documentation

## 2020-06-28 LAB — ECHOCARDIOGRAM COMPLETE
Area-P 1/2: 4.21 cm2
S' Lateral: 2.7 cm

## 2020-07-02 DIAGNOSIS — J329 Chronic sinusitis, unspecified: Secondary | ICD-10-CM | POA: Insufficient documentation

## 2020-07-02 DIAGNOSIS — K219 Gastro-esophageal reflux disease without esophagitis: Secondary | ICD-10-CM | POA: Insufficient documentation

## 2020-07-02 DIAGNOSIS — K449 Diaphragmatic hernia without obstruction or gangrene: Secondary | ICD-10-CM | POA: Insufficient documentation

## 2020-07-02 DIAGNOSIS — K2270 Barrett's esophagus without dysplasia: Secondary | ICD-10-CM | POA: Insufficient documentation

## 2020-07-02 DIAGNOSIS — T7840XA Allergy, unspecified, initial encounter: Secondary | ICD-10-CM | POA: Insufficient documentation

## 2020-07-02 DIAGNOSIS — B9681 Helicobacter pylori [H. pylori] as the cause of diseases classified elsewhere: Secondary | ICD-10-CM | POA: Insufficient documentation

## 2020-07-04 ENCOUNTER — Other Ambulatory Visit: Payer: Self-pay

## 2020-07-04 ENCOUNTER — Encounter: Payer: Self-pay | Admitting: Cardiology

## 2020-07-04 ENCOUNTER — Ambulatory Visit: Payer: 59 | Admitting: Cardiology

## 2020-07-04 VITALS — BP 136/98 | HR 91 | Ht 69.0 in | Wt 195.1 lb

## 2020-07-04 DIAGNOSIS — I10 Essential (primary) hypertension: Secondary | ICD-10-CM | POA: Insufficient documentation

## 2020-07-04 DIAGNOSIS — E782 Mixed hyperlipidemia: Secondary | ICD-10-CM

## 2020-07-04 DIAGNOSIS — R002 Palpitations: Secondary | ICD-10-CM | POA: Diagnosis not present

## 2020-07-04 MED ORDER — PROPRANOLOL HCL 20 MG PO TABS: 20.0000 mg | ORAL_TABLET | Freq: Two times a day (BID) | ORAL | 3 refills | Status: DC

## 2020-07-04 NOTE — Patient Instructions (Addendum)
Medication Instructions:  Your physician has recommended you make the following change in your medication: INCREASE: Propranolol 20 mg twice daily *If you need a refill on your cardiac medications before your next appointment, please call your pharmacy*   Lab Work: Your physician recommends that you return for lab work: TODAY:  BMET, Mag, TSH, Lipids If you have labs (blood work) drawn today and your tests are completely normal, you will receive your results only by: Marland Kitchen MyChart Message (if you have MyChart) OR . A paper copy in the mail If you have any lab test that is abnormal or we need to change your treatment, we will call you to review the results.   Testing/Procedures: None   Follow-Up: At [facility], you and your health needs are our priority.  As part of our continuing mission to provide you with exceptional heart care, we have created designated Provider Care Teams.  These Care Teams include your primary Cardiologist (physician) and Advanced Practice Providers (APPs -  Physician Assistants and Nurse Practitioners) who all work together to provide you with the care you need, when you need it.  We recommend signing up for the patient portal called "MyChart".  Sign up information is provided on this After Visit Summary.  MyChart is used to connect with patients for Virtual Visits (Telemedicine).  Patients are able to view lab/test results, encounter notes, upcoming appointments, etc.  Non-urgent messages can be sent to your provider as well.   To learn more about what you can do with MyChart, go to [email or web address].    Your next appointment:   6 month(s)  The format for your next appointment:   In Person  Provider:   Berniece Salines, DO   Other Instructions  TAKE YOUR BLOOD PRESSURE DAILY AND SEND IN A [place].

## 2020-07-04 NOTE — Progress Notes (Signed)
Cardiology Office Note:    Date:  07/04/2020   ID:  [name] [name], DOB 1970/06/07, MRN [id number]  PCP:  [name] Nutting, DO  Cardiologist:  Berniece Salines, DO  Electrophysiologist:  None   Referring MD: [name] Nutting, DO   I am feeling a little better  History of Present Illness:    [name] [name] is a 50 y.o. male with a hx of allergic rhinitis, anxiety, chronic 2024 mg, hyperlipidemia I did see the patient last in February 2022 at that time he was experiencing intermittent palpitations. At the conclusion of the visit I started the patient on propanolol 20 mg daily.  In addition she has had his lipid profile which show mixed hyperlipidemia and I recommended this patient.  He preferred to get this lab done at the [place] office.  He is here today for a follow up visit. He tells me that the propranolol has helped but he still does have some breakthrough palpitations.  Patient tells me he had his blood work done but unfortunately we are unable to locate these labs result.  Past Medical History:  Diagnosis Date  . Allergy   . Barrett's esophagus determined by biopsy   . Chronic sinusitis   . Gallbladder polyp 09/21/2018   4 mm  . GERD (gastroesophageal reflux disease)   . Helicobacter pylori gastritis   . Hiatal hernia   . Mixed hyperlipidemia 03/11/2018    Past Surgical History:  Procedure Laterality Date  . SINUS IRRIGATION    . UPPER GI ENDOSCOPY      Current Medications: Current Meds  Medication Sig  . fexofenadine (ALLEGRA) 180 MG tablet Take 1 tablet (180 mg total) by mouth at bedtime.  . fluticasone (FLONASE) 50 MCG/ACT nasal spray PLACE 1 SPRAY INTO BOTH NOSTRILS DAILY.  Marland Kitchen omeprazole (PRILOSEC) 40 MG capsule TAKE 1 CAPSULE(40 MG) BY MOUTH IN THE MORNING AND AT BEDTIME  . propranolol (INDERAL) 20 MG tablet Take 1 tablet (20 mg total) by mouth 2 (two) times daily.  . rosuvastatin (CRESTOR) 5 MG tablet Take 1 tablet (5 mg total) by mouth daily.  . [DISCONTINUED]  propranolol (INDERAL) 20 MG tablet Take 1 tablet (20 mg total) by mouth at bedtime.     Allergies:   Patient has no known allergies.   Social History   Socioeconomic History  . Marital status: Single    Spouse name: Not on file  . Number of children: 4  . Years of education: Not on file  . Highest education level: Not on file  Occupational History  . Occupation: Curator  Tobacco Use  . Smoking status: Never Smoker  . Smokeless tobacco: Never Used  Vaping Use  . Vaping Use: Never used  Substance and Sexual Activity  . Alcohol use: Yes    Alcohol/week: 14.0 standard drinks    Types: 14 Cans of beer per week  . Drug use: No  . Sexual activity: Yes    Birth control/protection: None  Other Topics Concern  . Not on file  Social History Narrative  . Not on file   Social Determinants of Health   Financial Resource Strain: Not on file  Food Insecurity: Not on file  Transportation Needs: Not on file  Physical Activity: Not on file  Stress: Not on file  Social Connections: Not on file     Family History: The patient's family history includes Colon cancer in his paternal aunt; Esophageal cancer in his father; Hypertension in his father; Stomach cancer in his brother; Throat  cancer in his father. There is no history of Rectal cancer or Colon polyps.  ROS:   Review of Systems  Constitution: Negative for decreased appetite, fever and weight gain.  HENT: Negative for congestion, ear discharge, hoarse voice and sore throat.   Eyes: Negative for discharge, redness, vision loss in right eye and visual halos.  Cardiovascular: Negative for chest pain, dyspnea on exertion, leg swelling, orthopnea and palpitations.  Respiratory: Negative for cough, hemoptysis, shortness of breath and snoring.   Endocrine: Negative for heat intolerance and polyphagia.  Hematologic/Lymphatic: Negative for bleeding problem. Does not bruise/bleed easily.  Skin: Negative for flushing, nail changes, rash and  suspicious lesions.  Musculoskeletal: Negative for arthritis, joint pain, muscle cramps, myalgias, neck pain and stiffness.  Gastrointestinal: Negative for abdominal pain, bowel incontinence, diarrhea and excessive appetite.  Genitourinary: Negative for decreased libido, genital sores and incomplete emptying.  Neurological: Negative for brief paralysis, focal weakness, headaches and loss of balance.  Psychiatric/Behavioral: Negative for altered mental status, depression and suicidal ideas.  Allergic/Immunologic: Negative for HIV exposure and persistent infections.    EKGs/Labs/Other Studies Reviewed:    The following studies were reviewed today:   EKG:  None today   Recent Labs: 10/26/2019: ALT 22; BUN 9; Creat 1.06; Hemoglobin 16.4; Platelets 290; Potassium 4.0; Sodium 141  Recent Lipid Panel    Component Value Date/Time   CHOL 317 (H) 10/26/2019 0953   CHOL 331 (H) 02/21/2019 1510   TRIG 377 (H) 10/26/2019 0953   HDL 33 (L) 10/26/2019 0953   HDL 40 02/21/2019 1510   CHOLHDL 9.6 (H) 10/26/2019 0953   LDLCALC 220 (H) 10/26/2019 0953   LDLDIRECT 257 (H) 02/21/2019 1510    Physical Exam:    VS:  BP (!) 136/98   Pulse 91   Ht 5\' 9"  (1.753 m)   Wt 195 lb 1.6 oz (88.5 kg)   SpO2 94%   BMI 28.81 kg/m     Wt Readings from Last 3 Encounters:  07/04/20 195 lb 1.6 oz (88.5 kg)  05/11/20 196 lb (88.9 kg)  05/08/20 195 lb 14.4 oz (88.9 kg)     GEN: Well nourished, well developed in no acute distress HEENT: Normal NECK: No JVD; No carotid bruits LYMPHATICS: No lymphadenopathy CARDIAC: S1S2 noted,RRR, no murmurs, rubs, gallops RESPIRATORY:  Clear to auscultation without rales, wheezing or rhonchi  ABDOMEN: Soft, non-tender, non-distended, +bowel sounds, no guarding. EXTREMITIES: No edema, No cyanosis, no clubbing MUSCULOSKELETAL:  No deformity  SKIN: Warm and dry NEUROLOGIC:  Alert and oriented x 3, non-focal PSYCHIATRIC:  Normal affect, good insight  ASSESSMENT:    1.  Hypertension, unspecified type   2. Mixed hyperlipidemia   3. Palpitations    PLAN:     His blood pressure is elevated in the office today.  He also is experiencing breakthrough palpitations and then increase his propanolol to 20 mg twice a day.  I also advised the patient that he could take his blood pressure daily and maintain- he will send me the information in two weeks, if it is elevated the plan is to add additional antihypertensive medication likely hydrochlorothiazide 12.5 mg daily.  He reports that he had blood work done at [facility] but this is not available, we will check with the lab. If we can not locate his lipid panel it would need to be repeated.   The patient is in agreement with the above plan. The patient left the office in stable condition.  The patient will follow up  in 6 months.   Medication Adjustments/Labs and Tests Ordered: Current medicines are reviewed at length with the patient today.  Concerns regarding medicines are outlined above.  No orders of the defined types were placed in this encounter.  Meds ordered this encounter  Medications  . propranolol (INDERAL) 20 MG tablet    Sig: Take 1 tablet (20 mg total) by mouth 2 (two) times daily.    Dispense:  180 tablet    Refill:  3    Patient Instructions  Medication Instructions:  Your physician has recommended you make the following change in your medication: INCREASE: Propranolol 20 mg twice daily *If you need a refill on your cardiac medications before your next appointment, please call your pharmacy*   Lab Work: Your physician recommends that you return for lab work: TODAY:  BMET, Mag, TSH, Lipids If you have labs (blood work) drawn today and your tests are completely normal, you will receive your results only by: Marland Kitchen MyChart Message (if you have MyChart) OR . A paper copy in the mail If you have any lab test that is abnormal or we need to change your treatment, we will call you to review the  results.   Testing/Procedures: None   Follow-Up: At [facility], you and your health needs are our priority.  As part of our continuing mission to provide you with exceptional heart care, we have created designated Provider Care Teams.  These Care Teams include your primary Cardiologist (physician) and Advanced Practice Providers (APPs -  Physician Assistants and Nurse Practitioners) who all work together to provide you with the care you need, when you need it.  We recommend signing up for the patient portal called "MyChart".  Sign up information is provided on this After Visit Summary.  MyChart is used to connect with patients for Virtual Visits (Telemedicine).  Patients are able to view lab/test results, encounter notes, upcoming appointments, etc.  Non-urgent messages can be sent to your provider as well.   To learn more about what you can do with MyChart, go to [email or web address].    Your next appointment:   6 month(s)  The format for your next appointment:   In Person  Provider:   Berniece Salines, DO   Other Instructions  TAKE YOUR BLOOD PRESSURE DAILY AND SEND IN A [place].     Adopting a Healthy Lifestyle.  Know what a healthy weight is for you (roughly BMI <25) and aim to maintain this   Aim for 7+ servings of fruits and vegetables daily   65-80+ fluid ounces of water or unsweet tea for healthy kidneys   Limit to max 1 drink of alcohol per day; avoid smoking/tobacco   Limit animal fats in diet for cholesterol and heart health - choose grass fed whenever available   Avoid highly processed foods, and foods high in saturated/trans fats   Aim for low stress - take time to unwind and care for your mental health   Aim for 150 min of moderate intensity exercise weekly for heart health, and weights twice weekly for bone health   Aim for 7-9 hours of sleep daily   When it comes to diets, agreement about the perfect plan isnt easy to find, even among the  experts. Experts at the [employer] [place] developed an idea known as the Healthy Eating Plate. Just imagine a plate divided into logical, healthy portions.   The emphasis is on diet quality:   Load up on vegetables and  fruits - one-half of your plate: Aim for color and variety, and remember that potatoes dont count.   Go for whole grains - one-quarter of your plate: Whole wheat, barley, wheat berries, quinoa, oats, brown rice, and foods made with them. If you want pasta, go with whole wheat pasta.   Protein power - one-quarter of your plate: Fish, chicken, beans, and nuts are all healthy, versatile protein sources. Limit red meat.   The diet, however, does go beyond the plate, offering a few other suggestions.   Use healthy plant oils, such as olive, canola, soy, corn, sunflower and peanut. Check the labels, and avoid partially hydrogenated oil, which have unhealthy trans fats.   If youre thirsty, drink water. Coffee and tea are good in moderation, but skip sugary drinks and limit milk and dairy products to one or two daily servings.   The type of carbohydrate in the diet is more important than the amount. Some sources of carbohydrates, such as vegetables, fruits, whole grains, and beans-are healthier than others.   Finally, stay active  Signed, Berniece Salines, DO  07/04/2020 2:49 PM    [facility]

## 2020-07-16 ENCOUNTER — Other Ambulatory Visit (HOSPITAL_BASED_OUTPATIENT_CLINIC_OR_DEPARTMENT_OTHER): Payer: 59

## 2020-08-21 ENCOUNTER — Other Ambulatory Visit: Payer: Self-pay | Admitting: Family Medicine

## 2020-08-21 DIAGNOSIS — K2100 Gastro-esophageal reflux disease with esophagitis, without bleeding: Secondary | ICD-10-CM

## 2020-08-23 MED ORDER — OMEPRAZOLE 40 MG PO CPDR: DELAYED_RELEASE_CAPSULE | ORAL | 0 refills | Status: DC

## 2020-09-04 ENCOUNTER — Encounter: Payer: Self-pay | Admitting: Medical-Surgical

## 2020-09-04 ENCOUNTER — Ambulatory Visit: Payer: 59 | Admitting: Medical-Surgical

## 2020-09-04 ENCOUNTER — Other Ambulatory Visit: Payer: Self-pay

## 2020-09-04 VITALS — BP 109/72 | HR 89 | Temp 98.0°F | Ht 69.0 in | Wt 193.0 lb

## 2020-09-04 DIAGNOSIS — A084 Viral intestinal infection, unspecified: Secondary | ICD-10-CM

## 2020-09-04 MED ORDER — DICYCLOMINE HCL 10 MG PO CAPS: 10.0000 mg | ORAL_CAPSULE | Freq: Three times a day (TID) | ORAL | 1 refills | Status: DC

## 2020-09-04 MED ORDER — ONDANSETRON 8 MG PO TBDP: 8.0000 mg | ORAL_TABLET | Freq: Three times a day (TID) | ORAL | 3 refills | Status: AC | PRN

## 2020-09-04 NOTE — Progress Notes (Signed)
Subjective:    CC: GI symptoms  HPI: Pleasant 50 year old male presenting today with reports of approximately 3 days of GI symptoms.  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  That evening he did have some alcohol intake.  On Sunday he woke up feeling a little off and started having some stomach cramps.  Shortly after he started having watery diarrhea.  He is having 10-12 yellow watery stools on average per day.  He has not seen any blood or black stools.  He has had some intermittent nausea but did start some vomiting this morning.  He has felt feverish and noted his eyes burning but has not had any documented fevers.  Yesterday noted that his pulse was increased.  He has able to hold down liquids but food has been a challenge.  I reviewed the past medical history, family history, social history, surgical history, and allergies today and no changes were needed.  Please see the problem list section below in epic for further details.  Past Medical History: Past Medical History:  Diagnosis Date   Allergy    Barrett's esophagus determined by biopsy    Chronic sinusitis    Gallbladder polyp 09/21/2018   4 mm   GERD (gastroesophageal reflux disease)    Helicobacter pylori gastritis    Hiatal hernia    Mixed hyperlipidemia 03/11/2018   Past Surgical History: Past Surgical History:  Procedure Laterality Date   SINUS IRRIGATION     UPPER GI ENDOSCOPY     Social History: Social History   Socioeconomic History   Marital status: Single    Spouse name: Not on file   Number of children: 4   Years of education: Not on file   Highest education level: Not on file  Occupational History   Occupation: painter  Tobacco Use   Smoking status: Never   Smokeless tobacco: Never  Vaping Use   Vaping Use: Never used  Substance and Sexual Activity   Alcohol use: Yes    Alcohol/week: 14.0 standard drinks    Types: 14 Cans of beer per week   Drug use: No   Sexual  activity: Yes    Birth control/protection: None  Other Topics Concern   Not on file  Social History Narrative   Not on file   Social Determinants of Health   Financial Resource Strain: Not on file  Food Insecurity: Not on file  Transportation Needs: Not on file  Physical Activity: Not on file  Stress: Not on file  Social Connections: Not on file   Family History: Family History  Problem Relation Age of Onset   Hypertension Father    Throat cancer Father    Esophageal cancer Father    Stomach cancer Brother    Colon cancer Paternal Aunt    Rectal cancer Neg Hx    Colon polyps Neg Hx    Allergies: No Known Allergies Medications: See med rec.  Review of Systems: See HPI for pertinent positives and negatives.   Objective:    General: Well Developed, well nourished, and in no acute distress.  Neuro: Alert and oriented x3.  HEENT: Normocephalic, atraumatic.  Skin: Warm and dry. Cardiac: Regular rate and rhythm, no murmurs rubs or gallops, no lower extremity edema.  Respiratory: Clear to auscultation bilaterally. Not using accessory muscles, speaking in full sentences. Abdomen: Soft, diffusely tender, mildly distended. Bowel sounds + x 4 quadrants. No HSM appreciated.  Impression and Recommendations:  1. Viral gastroenteritis Symptoms are consistent with a viral gastroenteritis.  Discussed expected timeline for resolution of symptoms and emergency symptoms to monitor for.  We will go ahead and try dicyclomine 4 times daily as needed to help with the abdominal cramping.  Avoiding antidiarrheals at this point so this gives his body a chance to clear the virus.  Tylenol and ibuprofen as needed for fever/discomfort.  Discussed starting with clear liquids and progressing slowly with small portions, frequent meals, and bland foods.  Reviewed the importance of staying well-hydrated.  Return if symptoms worsen or fail to improve. ___________________________________________ Clearnce Sorrel, DNP, APRN, FNP-BC [facility] [place]

## 2020-10-07 IMAGING — US US ABDOMEN LIMITED
1 series · 14 of 25 positions shown · non-contrast
Comparison: 09/17/2018

CLINICAL DATA: Abnormal liver enzymes.  Bloating.

EXAM:
ULTRASOUND ABDOMEN LIMITED RIGHT UPPER QUADRANT

[Series 1: us abdomen limited · 0.22mm/px · 14 of 94 slices shown]
[im 1/94]
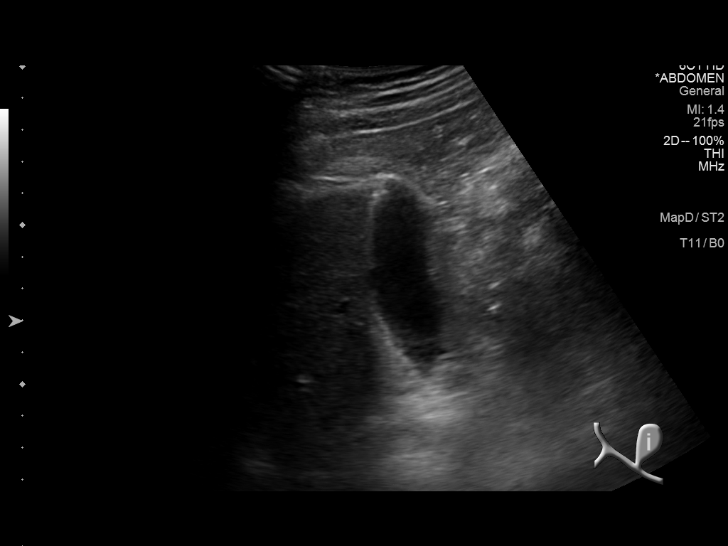
[im 8/94]
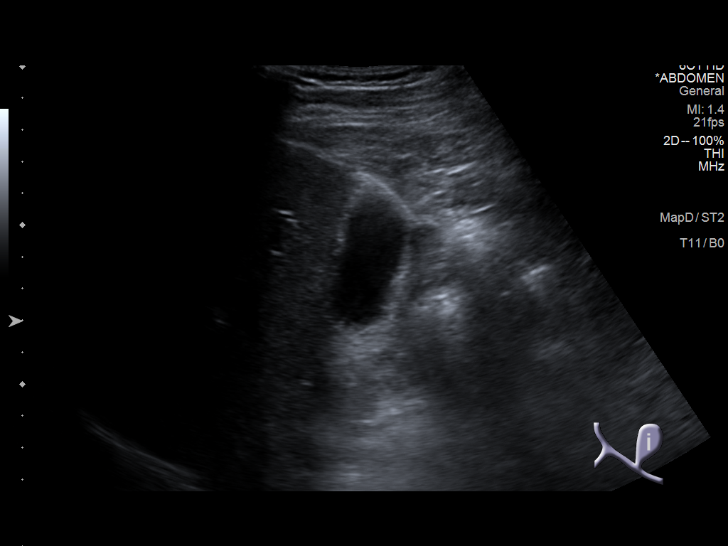
[im 16/94]
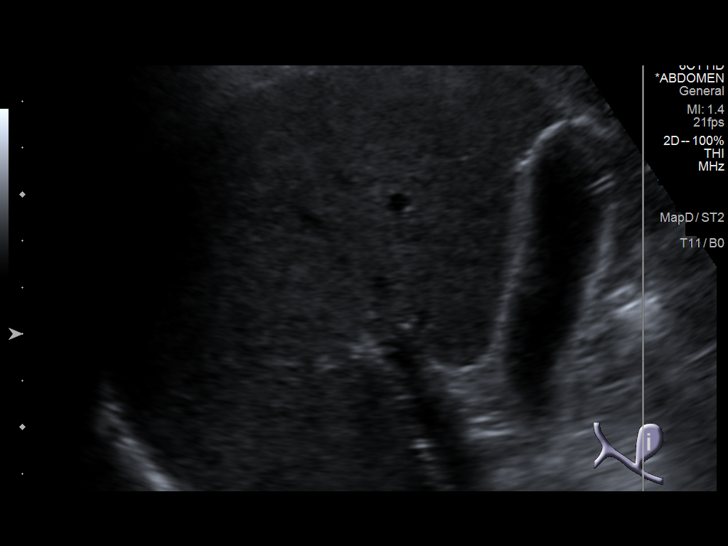
[im 24/94]
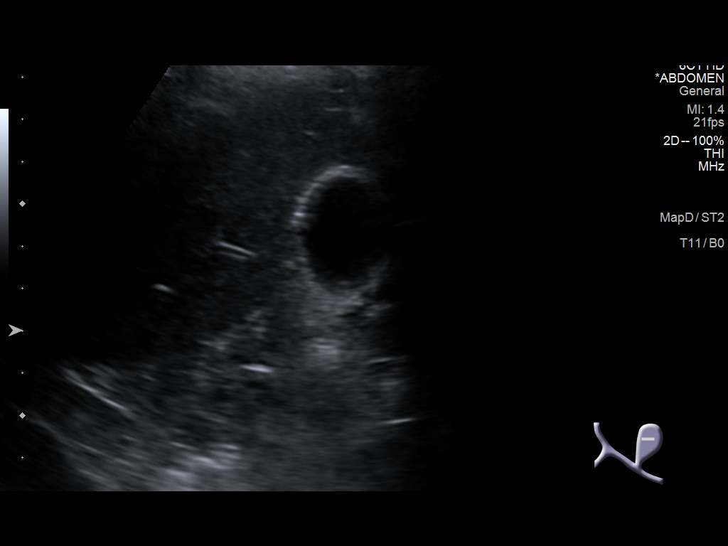
[im 32/94]
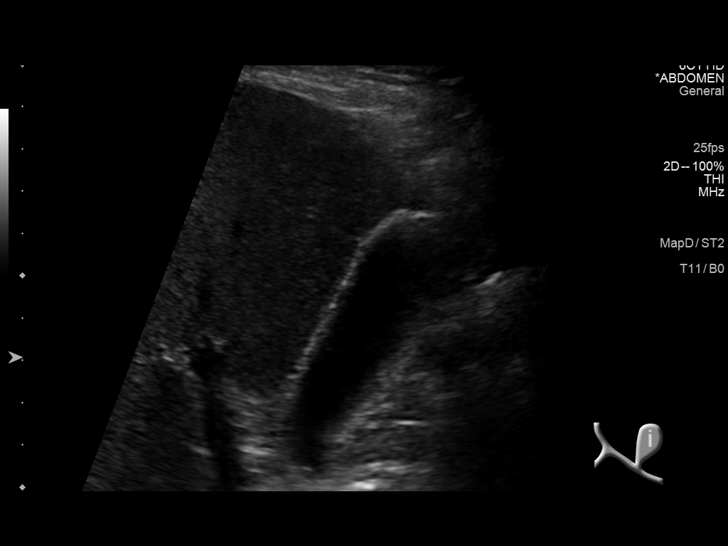
[im 35/94]
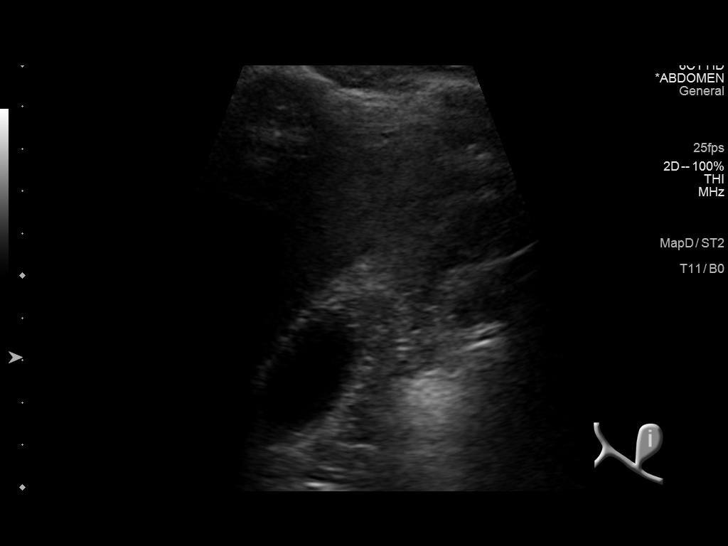
[im 43/94]
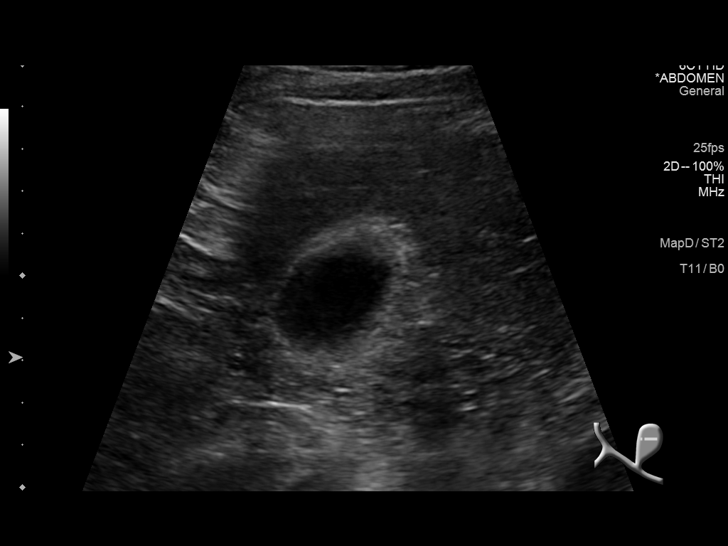
[im 51/94]
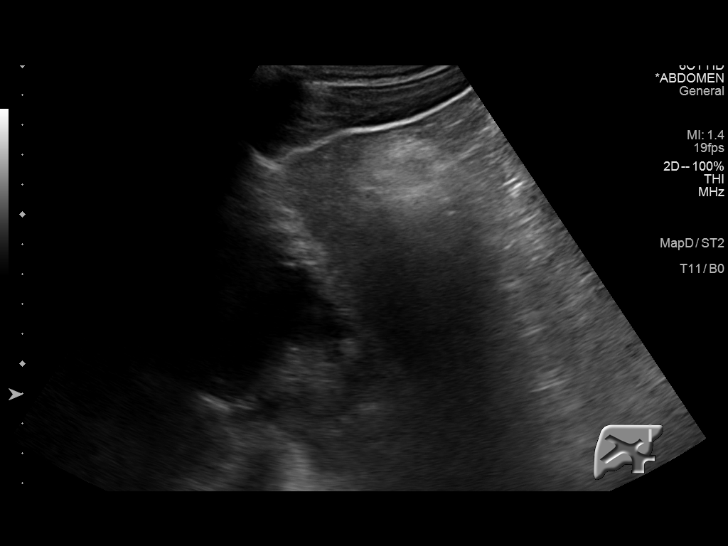
[im 59/94]
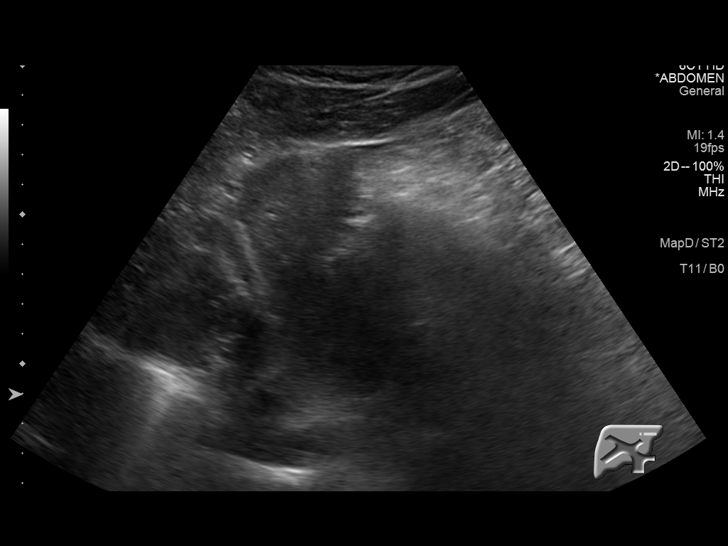
[im 63/94]
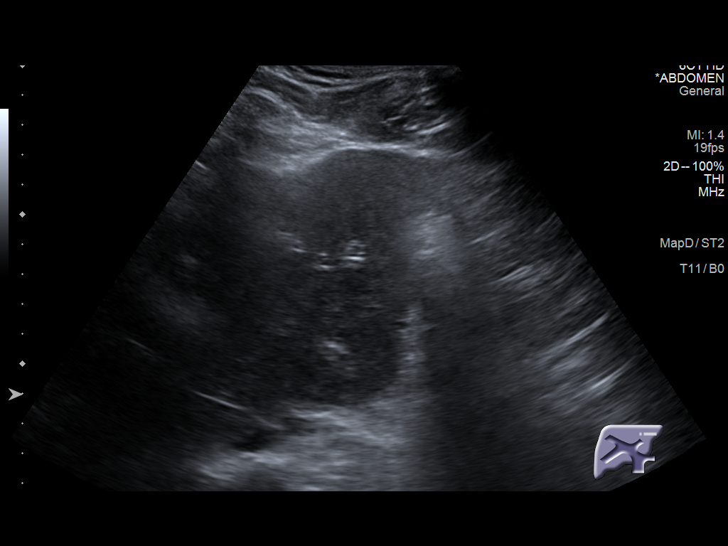
[im 70/94]
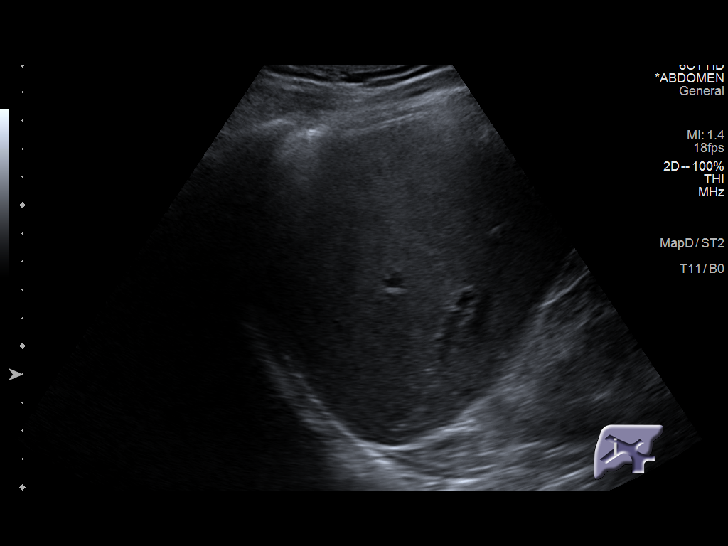
[im 78/94]
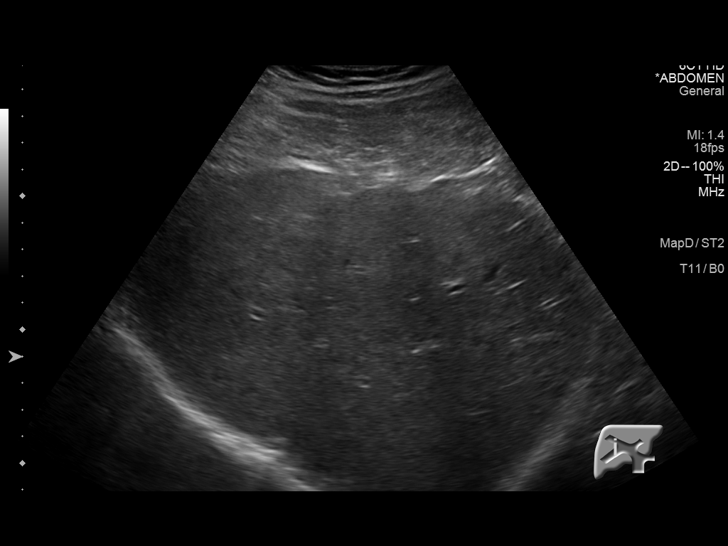
[im 86/94]
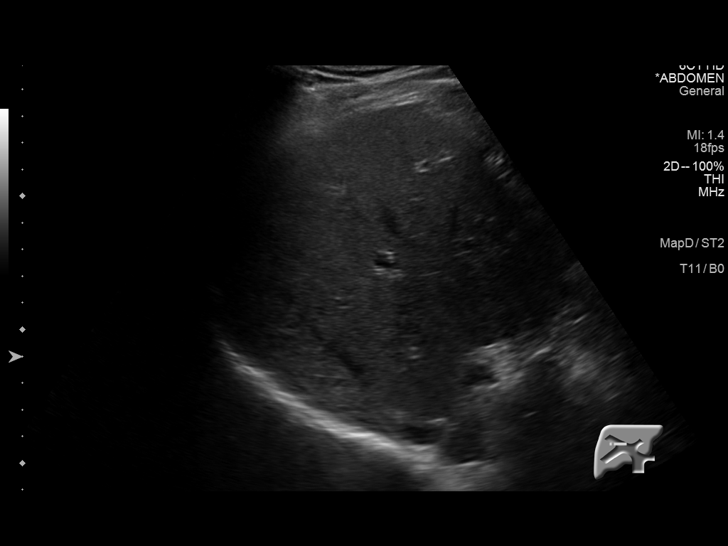
[im 94/94]
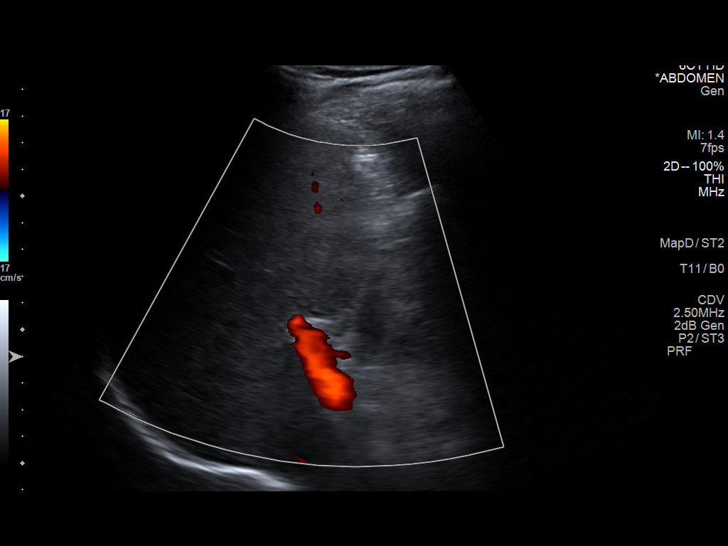

[14 of 25 positions shown; findings below may reference images not displayed]

FINDINGS: Gallbladder:

No gallstones or sludge. No wall thickening or surrounding fluid.
Few small gallbladder polyps, the largest 2 mm. Negative Murphy
sign.

Common bile duct:

Diameter: 1 or 2 mm.  Not dilated.

Liver:

Normal echogenicity. No focal lesion or ductal dilatation. Portal
vein is patent on color Doppler imaging with normal direction of
blood flow towards the liver.

Other: None.
IMPRESSION: No abnormality seen to explain the clinical presentation. The
patient does have a few small gallbladder polyps but there does not
appear to be gallstones or sludge. No liver parenchymal lesion is
seen.

## 2020-11-22 ENCOUNTER — Encounter: Payer: Self-pay | Admitting: Family Medicine

## 2020-11-22 ENCOUNTER — Ambulatory Visit (INDEPENDENT_AMBULATORY_CARE_PROVIDER_SITE_OTHER): Payer: 59 | Admitting: Family Medicine

## 2020-11-22 ENCOUNTER — Other Ambulatory Visit: Payer: Self-pay

## 2020-11-22 VITALS — BP 131/77 | HR 66 | Ht 69.0 in | Wt 196.0 lb

## 2020-11-22 DIAGNOSIS — M5441 Lumbago with sciatica, right side: Secondary | ICD-10-CM | POA: Diagnosis not present

## 2020-11-22 MED ORDER — CYCLOBENZAPRINE HCL 10 MG PO TABS: 10.0000 mg | ORAL_TABLET | Freq: Three times a day (TID) | ORAL | 0 refills | Status: DC | PRN

## 2020-11-22 MED ORDER — PREDNISONE 50 MG PO TABS: ORAL_TABLET | ORAL | 0 refills | Status: DC

## 2020-11-22 MED ORDER — METHYLPREDNISOLONE ACETATE 80 MG/ML IJ SUSP
80.0000 mg | Freq: Once | INTRAMUSCULAR | Status: AC
  Administered 2020-11-22: 80 mg via INTRAMUSCULAR

## 2020-11-22 NOTE — Progress Notes (Signed)
[name] [name] - 50 y.o. male MRN [id number]  Date of birth: Mar 13, 1971  Subjective Chief Complaint  Patient presents with   Back Pain    HPI 30 is a 50 year old male here today with complaint of right-sided lower back pain.  This started a few days ago.  He denies any known injury or overuse.  Pain is worse with certain movements.  It does radiate down the back of the leg.  He denies weakness, numbness, tingling.  He has not tried anything for management of symptoms at home.  ROS:  A comprehensive ROS was completed and negative except as noted per HPI  No Known Allergies  Past Medical History:  Diagnosis Date   Allergy    Barrett's esophagus determined by biopsy    Chronic sinusitis    Gallbladder polyp 09/21/2018   4 mm   GERD (gastroesophageal reflux disease)    Helicobacter pylori gastritis    Hiatal hernia    Mixed hyperlipidemia 03/11/2018    Past Surgical History:  Procedure Laterality Date   SINUS IRRIGATION     UPPER GI ENDOSCOPY      Social History   Socioeconomic History   Marital status: Single    Spouse name: Not on file   Number of children: 4   Years of education: Not on file   Highest education level: Not on file  Occupational History   Occupation: painter  Tobacco Use   Smoking status: Never   Smokeless tobacco: Never  Vaping Use   Vaping Use: Never used  Substance and Sexual Activity   Alcohol use: Yes    Alcohol/week: 14.0 standard drinks    Types: 14 Cans of beer per week   Drug use: No   Sexual activity: Yes    Birth control/protection: None  Other Topics Concern   Not on file  Social History Narrative   Not on file   Social Determinants of Health   Financial Resource Strain: Not on file  Food Insecurity: Not on file  Transportation Needs: Not on file  Physical Activity: Not on file  Stress: Not on file  Social Connections: Not on file    Family History  Problem Relation Age of Onset   Hypertension Father    Throat cancer  Father    Esophageal cancer Father    Stomach cancer Brother    Colon cancer Paternal Aunt    Rectal cancer Neg Hx    Colon polyps Neg Hx     Health Maintenance  Topic Date Due   COVID-19 Vaccine (1) Never done   INFLUENZA VACCINE  Never done   Hepatitis C Screening  04/02/2021 (Originally 01/17/1991)   HIV Screening  04/02/2021 (Originally 01/17/1988)   TETANUS/TDAP  09/04/2021 (Originally 01/17/1992)   COLONOSCOPY (Pts 45-77yr Insurance coverage will need to be confirmed)  11/14/2021   Pneumococcal Vaccine 02164Years old  Aged Out   HPV VACCINES  Aged Out     ----------------------------------------------------------------------------------------------------------------------------------------------------------------------------------------------------------------- Physical Exam BP 131/77 (BP Location: Left Arm, Patient Position: Sitting, Cuff Size: Normal)   Pulse 66   Ht '5\' 9"'$  (1.753 m)   Wt 196 lb (88.9 kg)   SpO2 95%   BMI 28.94 kg/m   Physical Exam Constitutional:      Appearance: Normal appearance.  Eyes:     General: No scleral icterus. Cardiovascular:     Rate and Rhythm: Normal rate and regular rhythm.  Pulmonary:     Effort: Pulmonary effort is normal.     Breath  sounds: Normal breath sounds.  Musculoskeletal:     Cervical back: Neck supple.     Comments: Range of motion of lumbar spine is fairly good with more pain on extension.  Flexion without significant pain.  Positive straight leg raise on the right.  Strength and sensation is normal.  Neurological:     Mental Status: He is alert.  Psychiatric:        Mood and Affect: Mood normal.        Behavior: Behavior normal.    ------------------------------------------------------------------------------------------------------------------------------------------------------------------------------------------------------------------- Assessment and Plan  Acute right-sided low back pain with right-sided  sciatica 80 mg Depo-Medrol given today.  We will start steroid taper beginning tomorrow.  Flexeril added as well as needed for spasm.  Given handout for home stretches and exercises.  Follow-up if not improving.   No orders of the defined types were placed in this encounter.   No follow-ups on file.    This visit occurred during the SARS-CoV-2 public health emergency.  Safety protocols were in place, including screening questions prior to the visit, additional usage of staff PPE, and extensive cleaning of exam room while observing appropriate contact time as indicated for disinfecting solutions.

## 2020-11-22 NOTE — Assessment & Plan Note (Signed)
80 mg Depo-Medrol given today.  We will start steroid taper beginning tomorrow.  Flexeril added as well as needed for spasm.  Given handout for home stretches and exercises.  Follow-up if not improving.

## 2021-01-18 ENCOUNTER — Telehealth: Payer: 59 | Admitting: Family Medicine

## 2021-02-05 ENCOUNTER — Telehealth (INDEPENDENT_AMBULATORY_CARE_PROVIDER_SITE_OTHER): Payer: 59 | Admitting: Physician Assistant

## 2021-02-05 ENCOUNTER — Encounter: Payer: Self-pay | Admitting: Physician Assistant

## 2021-02-05 ENCOUNTER — Other Ambulatory Visit: Payer: Self-pay

## 2021-02-05 VITALS — BP 165/107 | HR 118 | Temp 101.6°F

## 2021-02-05 DIAGNOSIS — J101 Influenza due to other identified influenza virus with other respiratory manifestations: Secondary | ICD-10-CM

## 2021-02-05 DIAGNOSIS — R051 Acute cough: Secondary | ICD-10-CM | POA: Diagnosis not present

## 2021-02-05 DIAGNOSIS — J069 Acute upper respiratory infection, unspecified: Secondary | ICD-10-CM

## 2021-02-05 DIAGNOSIS — R6889 Other general symptoms and signs: Secondary | ICD-10-CM | POA: Diagnosis not present

## 2021-02-05 DIAGNOSIS — R509 Fever, unspecified: Secondary | ICD-10-CM

## 2021-02-05 LAB — POCT INFLUENZA A/B
Influenza A, POC: POSITIVE — AB
Influenza B, POC: NEGATIVE

## 2021-02-05 MED ORDER — OSELTAMIVIR PHOSPHATE 75 MG PO CAPS: 75.0000 mg | ORAL_CAPSULE | Freq: Two times a day (BID) | ORAL | 0 refills | Status: DC

## 2021-02-05 MED ORDER — IBUPROFEN 800 MG PO TABS: 800.0000 mg | ORAL_TABLET | Freq: Three times a day (TID) | ORAL | 0 refills | Status: DC | PRN

## 2021-02-05 NOTE — Progress Notes (Signed)
Please let patient know of results and that tamiflu was sent. Note in communications to be out for 5 days and fever free for 24 hours.

## 2021-02-05 NOTE — Progress Notes (Signed)
..Virtual Visit via Video Note  I connected with [name] [name] on 02/05/21 at  2:20 PM EST by a video enabled telemedicine application and verified that I am speaking with the correct person using two identifiers.  Location: Patient: home Provider: clinic  .Participating in visit:  Patient: [name] [name] Provider: Iran Planas PA-C Provider in training: [name] Laws PA-S   I discussed the limitations of evaluation and management by telemedicine and the availability of in person appointments. The patient expressed understanding and agreed to proceed.  History of Present Illness: Patient is a 50 year old male who presents to the clinic with fever, chills, fatigue, body aches since yesterday. Negative home covid test. Fever of 101.6 this am. Productive cough. Not taking anything right now. No covid vaccine. No sOB. His throat is very sore and hard to swallow. No exudate.   .. Active Ambulatory Problems    Diagnosis Date Noted   GERD with esophagitis 07/17/2016   Elevated blood pressure reading 07/17/2016   Chronic maxillary sinusitis 85/88/5027   History of Helicobacter pylori infection 10/08/2017   Chronic rhinitis 10/08/2017   Proteinuria 03/10/2018   Urethral discharge in male, without blood 03/10/2018   Mixed hyperlipidemia 03/11/2018   Decreased calculated GFR 03/29/2018   Elevated ALT measurement 09/13/2018   Renal insufficiency 09/13/2018   Bilirubinuria 09/13/2018   Ketonuria 09/13/2018   Abdominal distension 09/13/2018   Left upper quadrant abdominal tenderness without rebound tenderness 09/13/2018   History of bloody stools 09/13/2018   Gallbladder polyp 09/21/2018   Epigastric pain 10/26/2019   Neck pain 10/26/2019   COVID-19 04/27/2020   Neck mass 05/08/2020   Palpitations 05/08/2020   Sinusitis 05/08/2020   Hyperlipidemia 05/11/2020   Shortness of breath 05/11/2020   Hiatal hernia    Helicobacter pylori gastritis    GERD (gastroesophageal reflux disease)     Chronic sinusitis    Barrett's esophagus determined by biopsy    Allergy    Hypertension 07/04/2020   Acute right-sided low back pain with right-sided sciatica 11/22/2020   Resolved Ambulatory Problems    Diagnosis Date Noted   Acute pain of right shoulder 07/17/2016   Bilateral impacted cerumen 10/08/2017   No Additional Past Medical History      Observations/Objective: No acute distress Productive cough Fatigued and pale appearance No labored breathing  .Marland Kitchen Today's Vitals   02/05/21 1401  BP: (!) 165/107  Pulse: (!) 118  Temp: (!) 101.6 F (38.7 C)   There is no height or weight on file to calculate BMI.    Assessment and Plan: Marland KitchenMarland KitchenSteve was seen today for cough.  Diagnoses and all orders for this visit:  Influenza A -     oseltamivir (TAMIFLU) 75 MG capsule; Take 1 capsule (75 mg total) by mouth 2 (two) times daily.  Viral upper respiratory tract infection -     POCT Influenza A/B -     Novel Coronavirus, NAA ([facility])  Acute cough -     POCT Influenza A/B -     Novel Coronavirus, NAA ([facility])  Fever, unspecified fever cause -     POCT Influenza A/B -     Novel Coronavirus, NAA ([facility])  Influenza-like symptoms -     POCT Influenza A/B -     Novel Coronavirus, NAA ([facility])  Flu A positive.  Sent tamiflu and 800mg  ibuprofen.  Pending covid.  Discussed symptomatic care with tylenol cold sinus severe and delsym.  Gargle with salt water.  Not for work for 5 days.  Follow up  as needed or if symptoms worsen or persist.     Follow Up Instructions:    I discussed the assessment and treatment plan with the patient. The patient was provided an opportunity to ask questions and all were answered. The patient agreed with the plan and demonstrated an understanding of the instructions.   The patient was advised to call back or seek an in-person evaluation if the symptoms worsen or if the condition fails to improve as anticipated.    Iran Planas,  PA-C

## 2021-02-06 LAB — SARS-COV-2, NAA 2 DAY TAT

## 2021-02-06 LAB — NOVEL CORONAVIRUS, NAA: SARS-CoV-2, NAA: NOT DETECTED

## 2021-02-06 NOTE — Progress Notes (Signed)
Negative for covid

## 2021-02-13 ENCOUNTER — Telehealth: Payer: 59 | Admitting: Family

## 2021-02-13 DIAGNOSIS — J019 Acute sinusitis, unspecified: Secondary | ICD-10-CM | POA: Diagnosis not present

## 2021-02-13 MED ORDER — AMOXICILLIN-POT CLAVULANATE 875-125 MG PO TABS: 1.0000 | ORAL_TABLET | Freq: Two times a day (BID) | ORAL | 0 refills | Status: DC

## 2021-02-13 NOTE — Progress Notes (Signed)

## 2021-02-15 ENCOUNTER — Other Ambulatory Visit: Payer: Self-pay | Admitting: Family Medicine

## 2021-02-15 DIAGNOSIS — K2100 Gastro-esophageal reflux disease with esophagitis, without bleeding: Secondary | ICD-10-CM

## 2021-02-20 ENCOUNTER — Telehealth: Payer: Self-pay

## 2021-02-20 NOTE — Telephone Encounter (Signed)
Medication: omeprazole (PRILOSEC) 40 MG capsule Prior authorization submitted via CoverMyMeds on 02/20/2021 PA submission pending

## 2021-02-26 ENCOUNTER — Emergency Department
Admission: EM | Admit: 2021-02-26 | Discharge: 2021-02-26 | Disposition: A | Discharge status: Home / Self Care | Payer: 59 | Source: Home / Self Care | Attending: Family Medicine | Admitting: Family Medicine

## 2021-02-26 ENCOUNTER — Encounter: Payer: Self-pay | Admitting: Emergency Medicine

## 2021-02-26 ENCOUNTER — Other Ambulatory Visit: Payer: Self-pay

## 2021-02-26 DIAGNOSIS — R030 Elevated blood-pressure reading, without diagnosis of hypertension: Secondary | ICD-10-CM

## 2021-02-26 DIAGNOSIS — M26621 Arthralgia of right temporomandibular joint: Secondary | ICD-10-CM | POA: Diagnosis not present

## 2021-02-26 DIAGNOSIS — R600 Localized edema: Secondary | ICD-10-CM

## 2021-02-26 DIAGNOSIS — R6884 Jaw pain: Secondary | ICD-10-CM

## 2021-02-26 MED ORDER — HYDROCODONE-ACETAMINOPHEN 5-325 MG PO TABS: 1.0000 | ORAL_TABLET | Freq: Four times a day (QID) | ORAL | 0 refills | Status: DC | PRN

## 2021-02-26 MED ORDER — METHYLPREDNISOLONE 4 MG PO TBPK: ORAL_TABLET | ORAL | 0 refills | Status: DC

## 2021-02-26 MED ORDER — CYCLOBENZAPRINE HCL 10 MG PO TABS: 10.0000 mg | ORAL_TABLET | Freq: Every day | ORAL | 0 refills | Status: DC

## 2021-02-26 NOTE — ED Provider Notes (Signed)
[name]    CSN: [id number] Arrival date & time: 02/26/21  1222      History   Chief Complaint Chief Complaint  Patient presents with   Jaw Pain    Right    HPI [name] [name] is a 50 y.o. male.   HPI  Patient has pain in right jaw.  Swelling in right jaw.  Pain with chewing.  No fever or chills.  Recently recovered from flu.  No dental pain problems  Past Medical History:  Diagnosis Date   Allergy    Barrett's esophagus determined by biopsy    Chronic sinusitis    Gallbladder polyp 09/21/2018   4 mm   GERD (gastroesophageal reflux disease)    Helicobacter pylori gastritis    Hiatal hernia    Mixed hyperlipidemia 03/11/2018    Patient Active Problem List   Diagnosis Date Noted   Acute right-sided low back pain with right-sided sciatica 11/22/2020   Hypertension 07/04/2020   Hiatal hernia    Helicobacter pylori gastritis    GERD (gastroesophageal reflux disease)    Chronic sinusitis    Barrett's esophagus determined by biopsy    Allergy    Hyperlipidemia 05/11/2020   Shortness of breath 05/11/2020   Neck mass 05/08/2020   Palpitations 05/08/2020   Sinusitis 05/08/2020   COVID-19 04/27/2020   Neck pain 10/26/2019   Gallbladder polyp 09/21/2018   Elevated ALT measurement 09/13/2018   Renal insufficiency 09/13/2018   Bilirubinuria 09/13/2018   Abdominal distension 09/13/2018   Left upper quadrant abdominal tenderness without rebound tenderness 09/13/2018   History of bloody stools 09/13/2018   Decreased calculated GFR 03/29/2018   Mixed hyperlipidemia 03/11/2018   Proteinuria 50/11/3816   History of Helicobacter pylori infection 10/08/2017   Chronic rhinitis 10/08/2017   GERD with esophagitis 07/17/2016   Elevated blood pressure reading 07/17/2016   Chronic maxillary sinusitis 04/09/2015    Past Surgical History:  Procedure Laterality Date   SINUS IRRIGATION     UPPER GI ENDOSCOPY         Home Medications    Prior to Admission  medications   Medication Sig Start Date End Date Taking? Authorizing Provider  cyclobenzaprine (FLEXERIL) 10 MG tablet Take 1 tablet (10 mg total) by mouth at bedtime. 02/26/21  Yes Raylene Everts, MD  HYDROcodone-acetaminophen (NORCO/VICODIN) 5-325 MG tablet Take 1-2 tablets by mouth every 6 (six) hours as needed. 02/26/21  Yes Raylene Everts, MD  methylPREDNISolone (MEDROL DOSEPAK) 4 MG TBPK tablet tad 02/26/21  Yes Raylene Everts, MD  omeprazole (PRILOSEC) 40 MG capsule TAKE 1 CAPSULE(40 MG) BY MOUTH IN THE MORNING AND AT BEDTIME 02/18/21   Luetta Nutting, DO  ondansetron (ZOFRAN-ODT) 8 MG disintegrating tablet Take 1 tablet (8 mg total) by mouth every 8 (eight) hours as needed for nausea. 09/04/20   Samuel Bouche, NP  rosuvastatin (CRESTOR) 5 MG tablet Take 1 tablet (5 mg total) by mouth daily. 11/02/19   Emeterio Reeve, DO    Family History Family History  Problem Relation Age of Onset   Healthy Mother    Hypertension Father    Throat cancer Father    Esophageal cancer Father    Stomach cancer Brother    Colon cancer Paternal Aunt    Rectal cancer Neg Hx    Colon polyps Neg Hx     Social History Social History   Tobacco Use   Smoking status: Never   Smokeless tobacco: Never  Vaping Use   Vaping  Use: Never used  Substance Use Topics   Alcohol use: Yes    Alcohol/week: 14.0 standard drinks    Types: 14 Cans of beer per week   Drug use: No     Allergies   Patient has no known allergies.   Review of Systems Review of Systems See HPI  Physical Exam Triage Vital Signs ED Triage Vitals  Enc Vitals Group     BP 02/26/21 1420 (!) 149/97     Pulse Rate 02/26/21 1420 95     Resp 02/26/21 1420 16     Temp 02/26/21 1420 98.2 F (36.8 C)     Temp Source 02/26/21 1420 Oral     SpO2 02/26/21 1420 98 %     Weight 02/26/21 1422 186 lb (84.4 kg)     Height --      Head Circumference --      Peak Flow --      Pain Score 02/26/21 1422 4     Pain Loc --       Pain Edu? --      Excl. in [place]? --    No data found.  Updated Vital Signs BP (!) 149/97 (BP Location: Left Arm) Comment: denies HTN  Pulse 95   Temp 98.2 F (36.8 C) (Oral)   Resp 16   Wt 84.4 kg   SpO2 98%   BMI 27.47 kg/m      Physical Exam Constitutional:      General: He is not in acute distress.    Appearance: He is well-developed and normal weight.  HENT:     Head: Normocephalic and atraumatic.      Right Ear: Tympanic membrane and ear canal normal.     Left Ear: Tympanic membrane and ear canal normal.     Nose: Nose normal.     Mouth/Throat:     Mouth: Mucous membranes are moist.     Pharynx: No posterior oropharyngeal erythema.     Comments: There is no dental infection or pain with palpation of teeth Eyes:     Conjunctiva/sclera: Conjunctivae normal.     Pupils: Pupils are equal, round, and reactive to light.  Cardiovascular:     Rate and Rhythm: Normal rate.  Pulmonary:     Effort: Pulmonary effort is normal. No respiratory distress.  Abdominal:     General: There is no distension.     Palpations: Abdomen is soft.  Musculoskeletal:        General: Normal range of motion.     Cervical back: Normal range of motion.  Skin:    General: Skin is warm and dry.  Neurological:     Mental Status: He is alert.     UC Treatments / Results  Labs (all labs ordered are listed, but only abnormal results are displayed) Labs Reviewed - No data to display  EKG   Radiology No results found.  Procedures Procedures (including critical care time)  Medications Ordered in UC Medications - No data to display  Initial Impression / Assessment and Plan / UC Course  I have reviewed the triage vital signs and the nursing notes.  Pertinent labs & imaging results that were available during my care of the patient were reviewed by me and considered in my medical decision making (see chart for details).     Final Clinical Impressions(s) / UC Diagnoses   Final  diagnoses:  Arthralgia of right temporomandibular joint  Jaw pain  Swelling of right parotid gland  Elevated blood pressure reading     Discharge Instructions      Take the Medrol Dosepak as directed.  This is a steroid medication that will take down pain and inflammation around the joint.  Take all of day 1 today.  You can take 3 now and then 3 at bedtime Take the Flexeril at bedtime until your jaw joint gets better.  This is a muscle relaxer.  It can cause some dry mouth and drowsiness.  Take an hour before you go to sleep Take the pain medicine if needed.  This is hydrocodone.  Do not take hydrocodone and drive. Follow-up with your doctor as scheduled.  Keep track of your blood pressure as it is mildly elevated today   ED Prescriptions     Medication Sig Dispense Auth. Provider   methylPREDNISolone (MEDROL DOSEPAK) 4 MG TBPK tablet tad 21 tablet Raylene Everts, MD   cyclobenzaprine (FLEXERIL) 10 MG tablet Take 1 tablet (10 mg total) by mouth at bedtime. 20 tablet Raylene Everts, MD   HYDROcodone-acetaminophen (NORCO/VICODIN) 5-325 MG tablet Take 1-2 tablets by mouth every 6 (six) hours as needed. 10 tablet Raylene Everts, MD      I have reviewed the PDMP during this encounter.   Raylene Everts, MD 02/26/21 [phone]

## 2021-02-26 NOTE — Discharge Instructions (Signed)
Take the Medrol Dosepak as directed.  This is a steroid medication that will take down pain and inflammation around the joint.  Take all of day 1 today.  You can take 3 now and then 3 at bedtime Take the Flexeril at bedtime until your jaw joint gets better.  This is a muscle relaxer.  It can cause some dry mouth and drowsiness.  Take an hour before you go to sleep Take the pain medicine if needed.  This is hydrocodone.  Do not take hydrocodone and drive. Follow-up with your doctor as scheduled.  Keep track of your blood pressure as it is mildly elevated today

## 2021-02-26 NOTE — ED Triage Notes (Signed)
Right jaw pain since Sunday  Unable to chew on that side  Denies injury  Flu on 02/05/21

## 2021-03-04 ENCOUNTER — Ambulatory Visit: Payer: 59 | Admitting: Cardiology

## 2021-03-28 ENCOUNTER — Other Ambulatory Visit: Payer: Self-pay

## 2021-03-28 ENCOUNTER — Emergency Department (INDEPENDENT_AMBULATORY_CARE_PROVIDER_SITE_OTHER)
Admission: EM | Admit: 2021-03-28 | Discharge: 2021-03-28 | Disposition: A | Discharge status: Home / Self Care | Payer: 59 | Source: Home / Self Care

## 2021-03-28 DIAGNOSIS — K1121 Acute sialoadenitis: Secondary | ICD-10-CM

## 2021-03-28 DIAGNOSIS — H6691 Otitis media, unspecified, right ear: Secondary | ICD-10-CM | POA: Diagnosis not present

## 2021-03-28 MED ORDER — AMOXICILLIN-POT CLAVULANATE 875-125 MG PO TABS: 1.0000 | ORAL_TABLET | Freq: Two times a day (BID) | ORAL | 0 refills | Status: AC

## 2021-03-28 NOTE — Discharge Instructions (Addendum)
Advised patient to take medication as directed with food to completion.  Encouraged patient increase daily water intake while taking this medication.

## 2021-03-28 NOTE — ED Triage Notes (Signed)
Pt presents with rt ear pain that began this morning

## 2021-03-28 NOTE — ED Provider Notes (Signed)
[name]    CSN: [id number] Arrival date & time: 03/28/21  1301      History   Chief Complaint Chief Complaint  Patient presents with   Otalgia    rt    HPI [name] [name] is a 51 y.o. male.   HPI 51 year old male presents with right ear pain that began this morning.   Past Medical History:  Diagnosis Date   Allergy    Barrett's esophagus determined by biopsy    Chronic sinusitis    Gallbladder polyp 09/21/2018   4 mm   GERD (gastroesophageal reflux disease)    Helicobacter pylori gastritis    Hiatal hernia    Mixed hyperlipidemia 03/11/2018    Patient Active Problem List   Diagnosis Date Noted   Acute right-sided low back pain with right-sided sciatica 11/22/2020   Hypertension 07/04/2020   Hiatal hernia    Helicobacter pylori gastritis    GERD (gastroesophageal reflux disease)    Chronic sinusitis    Barrett's esophagus determined by biopsy    Allergy    Hyperlipidemia 05/11/2020   Shortness of breath 05/11/2020   Neck mass 05/08/2020   Palpitations 05/08/2020   Sinusitis 05/08/2020   COVID-19 04/27/2020   Neck pain 10/26/2019   Gallbladder polyp 09/21/2018   Elevated ALT measurement 09/13/2018   Renal insufficiency 09/13/2018   Bilirubinuria 09/13/2018   Abdominal distension 09/13/2018   Left upper quadrant abdominal tenderness without rebound tenderness 09/13/2018   History of bloody stools 09/13/2018   Decreased calculated GFR 03/29/2018   Mixed hyperlipidemia 03/11/2018   Proteinuria 63/14/9702   History of Helicobacter pylori infection 10/08/2017   Chronic rhinitis 10/08/2017   GERD with esophagitis 07/17/2016   Elevated blood pressure reading 07/17/2016   Chronic maxillary sinusitis 04/09/2015    Past Surgical History:  Procedure Laterality Date   SINUS IRRIGATION     UPPER GI ENDOSCOPY         Home Medications    Prior to Admission medications   Medication Sig Start Date End Date Taking? Authorizing Provider   amoxicillin-clavulanate (AUGMENTIN) 875-125 MG tablet Take 1 tablet by mouth every 12 (twelve) hours for 10 days. 03/28/21 04/07/21 Yes Eliezer Lofts, FNP  omeprazole (PRILOSEC) 40 MG capsule TAKE 1 CAPSULE(40 MG) BY MOUTH IN THE MORNING AND AT BEDTIME 02/18/21   Luetta Nutting, DO  cyclobenzaprine (FLEXERIL) 10 MG tablet Take 1 tablet (10 mg total) by mouth at bedtime. Patient not taking: Reported on 03/28/2021 02/26/21   Raylene Everts, MD  HYDROcodone-acetaminophen (NORCO/VICODIN) 5-325 MG tablet Take 1-2 tablets by mouth every 6 (six) hours as needed. 02/26/21   Raylene Everts, MD  methylPREDNISolone (MEDROL DOSEPAK) 4 MG TBPK tablet tad Patient not taking: Reported on 03/28/2021 02/26/21   Raylene Everts, MD  ondansetron (ZOFRAN-ODT) 8 MG disintegrating tablet Take 1 tablet (8 mg total) by mouth every 8 (eight) hours as needed for nausea. Patient not taking: Reported on 03/28/2021 09/04/20   Samuel Bouche, NP  rosuvastatin (CRESTOR) 5 MG tablet Take 1 tablet (5 mg total) by mouth daily. 11/02/19   Emeterio Reeve, DO    Family History Family History  Problem Relation Age of Onset   Healthy Mother    Hypertension Father    Throat cancer Father    Esophageal cancer Father    Stomach cancer Brother    Colon cancer Paternal Aunt    Rectal cancer Neg Hx    Colon polyps Neg Hx     Social History  Social History   Tobacco Use   Smoking status: Never   Smokeless tobacco: Never  Vaping Use   Vaping Use: Never used  Substance Use Topics   Alcohol use: Yes    Alcohol/week: 14.0 standard drinks    Types: 14 Cans of beer per week   Drug use: No     Allergies   Patient has no known allergies.   Review of Systems Review of Systems   Physical Exam Triage Vital Signs ED Triage Vitals [03/28/21 1308]  Enc Vitals Group     BP (!) 150/108     Pulse Rate (!) 101     Resp 14     Temp 98.1 F (36.7 C)     Temp Source Oral     SpO2 98 %     Weight 186 lb (84.4 kg)      Height      Head Circumference      Peak Flow      Pain Score 3     Pain Loc      Pain Edu?      Excl. in [place]?    No data found.  Updated Vital Signs BP (!) 153/107 (BP Location: Right Arm)    Pulse (!) 101    Temp 98.1 F (36.7 C) (Oral)    Resp 14    Wt 186 lb (84.4 kg)    SpO2 98%    BMI 27.47 kg/m     Physical Exam Vitals and nursing note reviewed.  Constitutional:      General: He is not in acute distress.    Appearance: Normal appearance. He is normal weight. He is not ill-appearing.  HENT:     Head:      Comments: Swelling TTP over right parotid gland    Right Ear: Tympanic membrane is erythematous and retracted.     Left Ear: Tympanic membrane, ear canal and external ear normal.     Mouth/Throat:     Mouth: Mucous membranes are moist.     Pharynx: Oropharynx is clear.  Eyes:     Extraocular Movements: Extraocular movements intact.     Conjunctiva/sclera: Conjunctivae normal.     Pupils: Pupils are equal, round, and reactive to light.  Cardiovascular:     Rate and Rhythm: Normal rate and regular rhythm.     Pulses: Normal pulses.     Heart sounds: Normal heart sounds. No murmur heard. Pulmonary:     Effort: Pulmonary effort is normal.     Breath sounds: Normal breath sounds. No wheezing, rhonchi or rales.  Musculoskeletal:     Cervical back: Normal range of motion and neck supple. No tenderness.  Lymphadenopathy:     Cervical: No cervical adenopathy.  Skin:    General: Skin is warm and dry.  Neurological:     General: No focal deficit present.     Mental Status: He is alert and oriented to person, place, and time.     UC Treatments / Results  Labs (all labs ordered are listed, but only abnormal results are displayed) Labs Reviewed - No data to display  EKG   Radiology No results found.  Procedures Procedures (including critical care time)  Medications Ordered in UC Medications - No data to display  Initial Impression / Assessment and Plan /  UC Course  I have reviewed the triage vital signs and the nursing notes.  Pertinent labs & imaging results that were available during my care of the patient  were reviewed by me and considered in my medical decision making (see chart for details).     MDM: 1.  Acute right otitis media-Rx'd Augmentin; 2.  Acute right parotitis-Rx'd Augmentin. Advised patient to take medication as directed with food to completion.  Encouraged patient increase daily water intake while taking this medication.  Work note provided prior to discharge per patient request.  Patient discharged home, hemodynamically stable. Final Clinical Impressions(s) / UC Diagnoses   Final diagnoses:  Acute right otitis media  Parotitis, acute     Discharge Instructions      Advised patient to take medication as directed with food to completion.  Encouraged patient increase daily water intake while taking this medication.     ED Prescriptions     Medication Sig Dispense Auth. Provider   amoxicillin-clavulanate (AUGMENTIN) 875-125 MG tablet Take 1 tablet by mouth every 12 (twelve) hours for 10 days. 20 tablet Eliezer Lofts, FNP      PDMP not reviewed this encounter.   Eliezer Lofts, [place] 03/28/21 1418

## 2021-04-02 ENCOUNTER — Ambulatory Visit: Payer: 59 | Admitting: Family Medicine

## 2021-05-31 ENCOUNTER — Other Ambulatory Visit: Payer: Self-pay | Admitting: Family Medicine

## 2021-05-31 DIAGNOSIS — K2100 Gastro-esophageal reflux disease with esophagitis, without bleeding: Secondary | ICD-10-CM

## 2021-06-24 IMAGING — DX DG CERVICAL SPINE COMPLETE 4+V
7 series · 7 of 7 positions shown · non-contrast
Comparison: None.

CLINICAL DATA: Neck pain

EXAM:
CERVICAL SPINE - COMPLETE 4+ VIEW

[c-spine lat (1 of 2)]
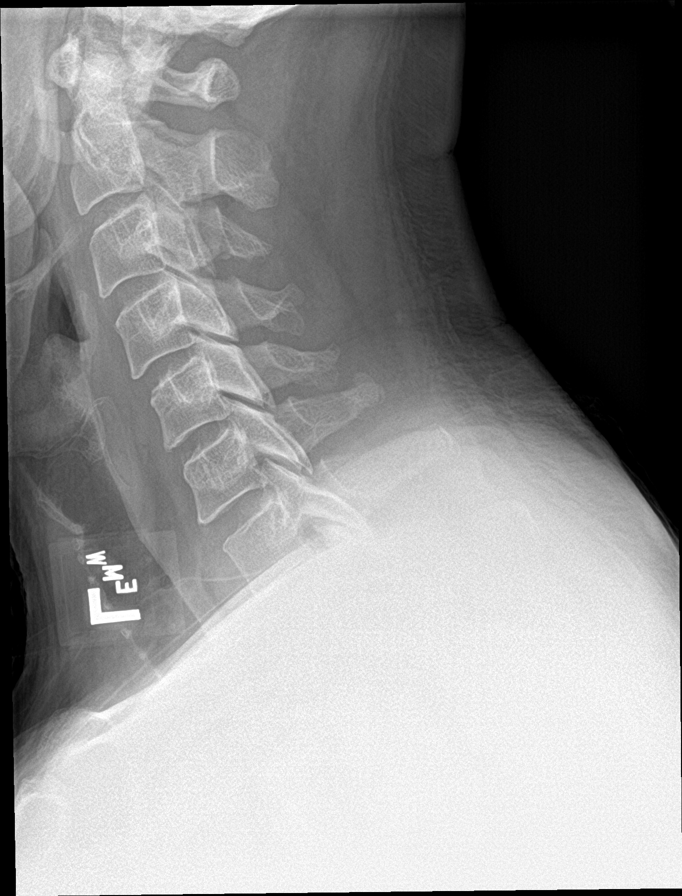

[c-spine obl (1 of 2)]
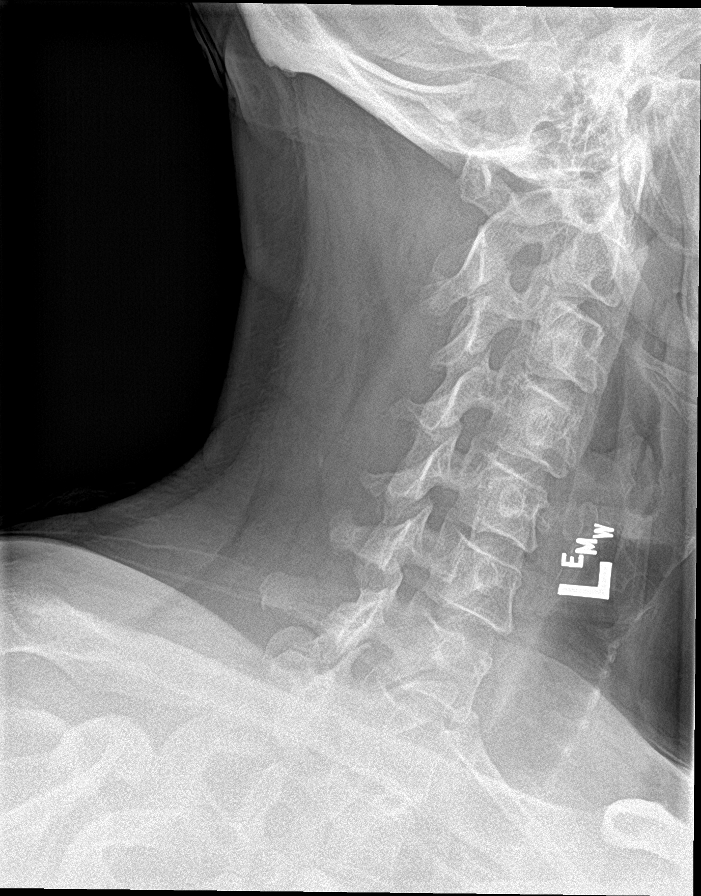

[c-spine obl (2 of 2)]
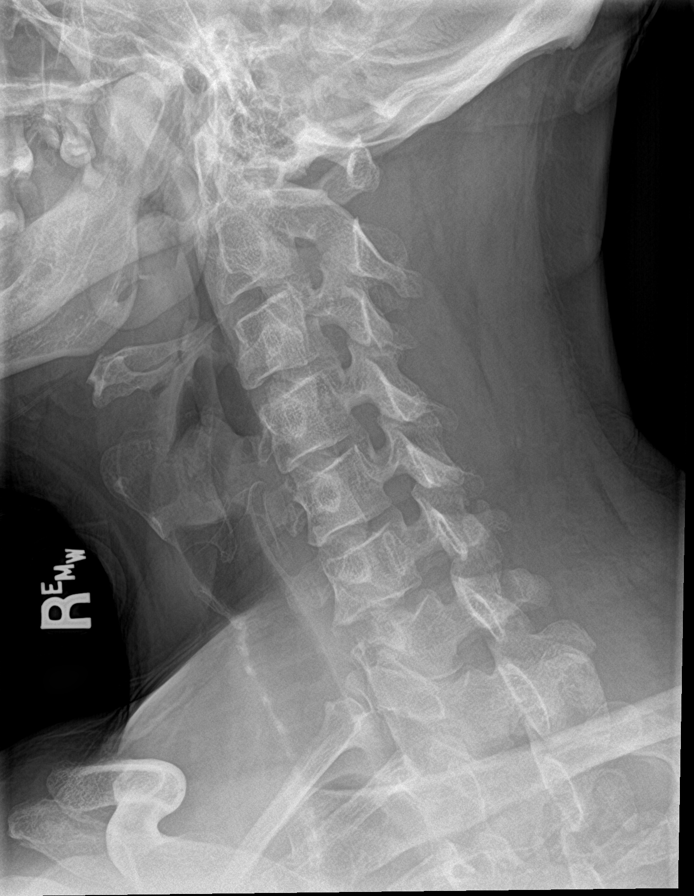

[c-spine ap]
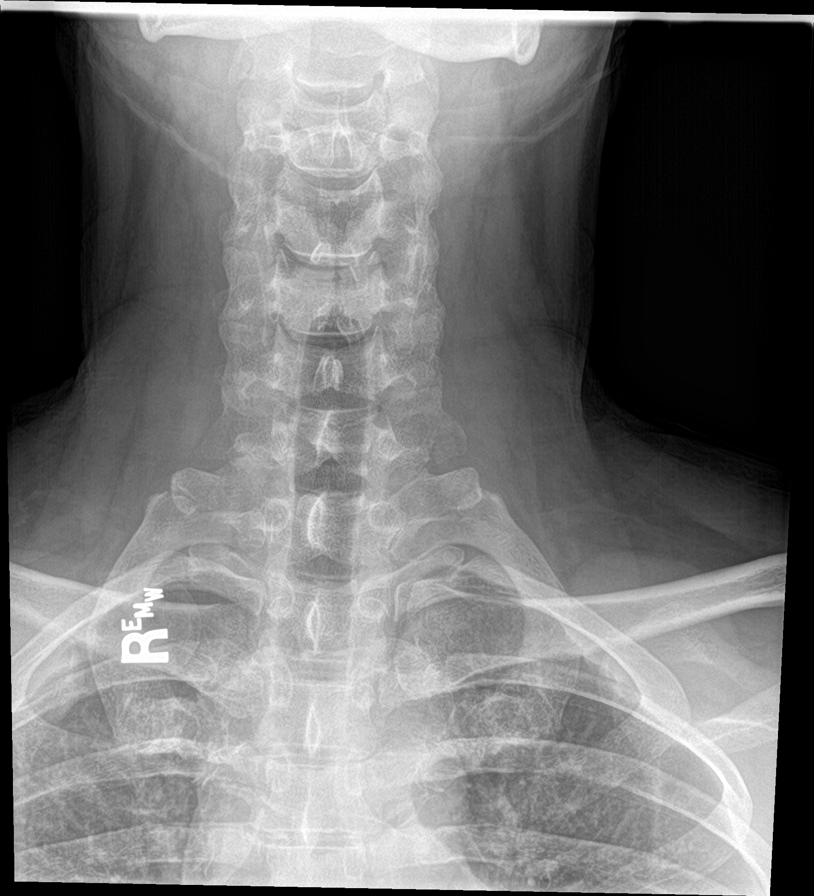

[c-spine open mouth]
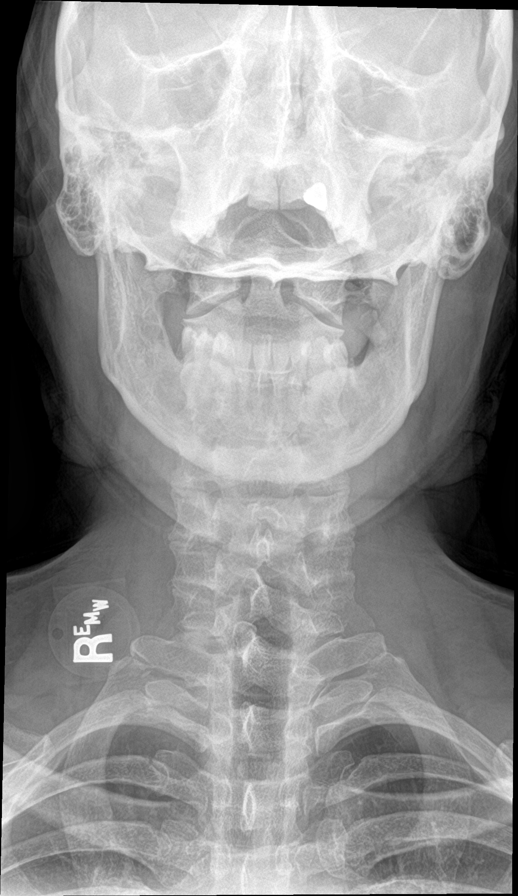

[c-spine swimmers]
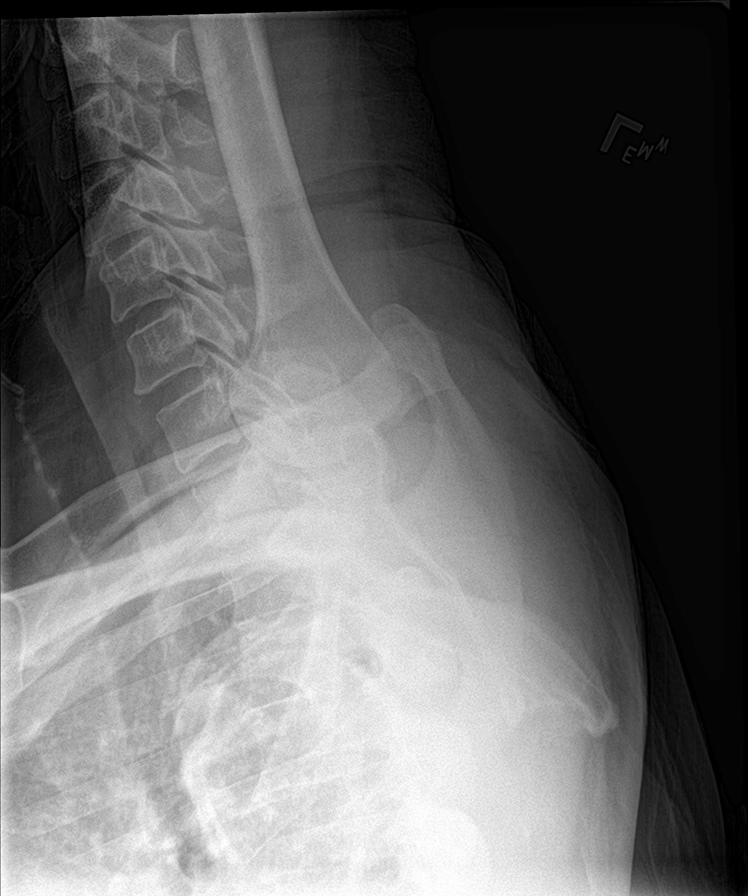

[c-spine lat (2 of 2)]
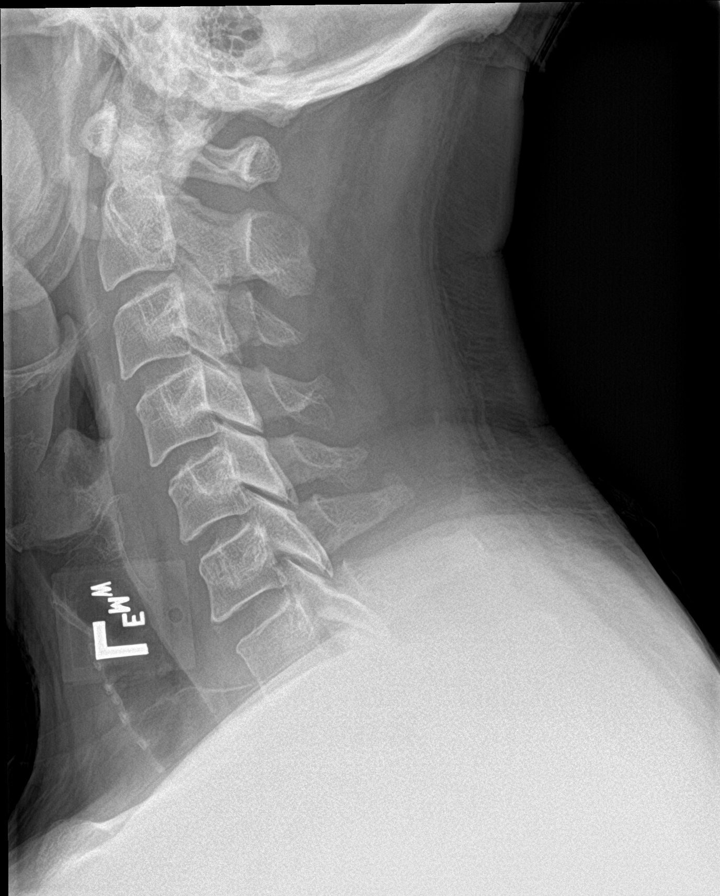

[7 of 7 positions shown; findings below may reference images not displayed]

FINDINGS: The cervical spine is visualized from C1-C7. No significant osseous
neuroforaminal narrowing.Cervical alignment is maintained. Vertebral
body heights are maintained: no evidence of acute fracture.
Intervertebral spaces are maintained without significant
degenerative changes. No prevertebral soft tissue swelling.
Visualized thorax is unremarkable.
IMPRESSION: Negative cervical spine radiographs.

## 2021-07-17 ENCOUNTER — Other Ambulatory Visit: Payer: Self-pay | Admitting: Physician Assistant

## 2021-07-17 ENCOUNTER — Other Ambulatory Visit: Payer: Self-pay | Admitting: Osteopathic Medicine

## 2021-07-17 DIAGNOSIS — B349 Viral infection, unspecified: Secondary | ICD-10-CM

## 2021-07-17 DIAGNOSIS — E782 Mixed hyperlipidemia: Secondary | ICD-10-CM

## 2021-07-17 DIAGNOSIS — J0100 Acute maxillary sinusitis, unspecified: Secondary | ICD-10-CM

## 2021-07-19 NOTE — Telephone Encounter (Signed)
Pls contact patient to schedule appt with Dr. Zigmund Daniel with labs. Sending 30 day refill. ?

## 2021-07-19 NOTE — Telephone Encounter (Signed)
Patient has been scheduled for Dr Zigmund Daniel next opening on 08/15/2021. AMUCK ?

## 2021-07-25 ENCOUNTER — Other Ambulatory Visit: Payer: Self-pay | Admitting: Neurology

## 2021-07-25 DIAGNOSIS — B349 Viral infection, unspecified: Secondary | ICD-10-CM

## 2021-07-25 DIAGNOSIS — J0100 Acute maxillary sinusitis, unspecified: Secondary | ICD-10-CM

## 2021-08-15 ENCOUNTER — Ambulatory Visit: Payer: Self-pay | Admitting: Family Medicine

## 2021-08-18 ENCOUNTER — Other Ambulatory Visit: Payer: Self-pay | Admitting: Family Medicine

## 2021-08-18 DIAGNOSIS — E782 Mixed hyperlipidemia: Secondary | ICD-10-CM

## 2021-08-18 DIAGNOSIS — J0100 Acute maxillary sinusitis, unspecified: Secondary | ICD-10-CM

## 2021-08-18 DIAGNOSIS — B349 Viral infection, unspecified: Secondary | ICD-10-CM

## 2021-08-20 ENCOUNTER — Other Ambulatory Visit: Payer: Self-pay

## 2021-08-20 DIAGNOSIS — B349 Viral infection, unspecified: Secondary | ICD-10-CM

## 2021-08-20 DIAGNOSIS — J0100 Acute maxillary sinusitis, unspecified: Secondary | ICD-10-CM

## 2021-08-20 DIAGNOSIS — E782 Mixed hyperlipidemia: Secondary | ICD-10-CM

## 2021-08-20 MED ORDER — ROSUVASTATIN CALCIUM 5 MG PO TABS: 5.0000 mg | ORAL_TABLET | Freq: Every day | ORAL | 0 refills | Status: AC

## 2021-08-20 MED ORDER — FLUTICASONE PROPIONATE 50 MCG/ACT NA SUSP: NASAL | 0 refills | Status: AC

## 2021-08-20 NOTE — Telephone Encounter (Signed)
Patient has been scheduled for 09/12/21. AMUCK

## 2021-08-20 NOTE — Telephone Encounter (Signed)
Sent 30 day refill to hold until appt.

## 2021-08-20 NOTE — Telephone Encounter (Signed)
Pls contact the patient to schedule regular in-office appt with Dr. Zigmund Daniel with fasting labs. Pt has only been seen for acute (sick) visits since Feb 2022. Did not have ordered labs completed in Feb 2022. Unable to provide refills without labs. Thanks

## 2021-09-12 ENCOUNTER — Ambulatory Visit: Payer: Self-pay | Admitting: Family Medicine

## 2021-09-16 ENCOUNTER — Telehealth: Payer: Self-pay | Admitting: Physician Assistant

## 2021-09-16 DIAGNOSIS — J020 Streptococcal pharyngitis: Secondary | ICD-10-CM

## 2021-09-16 MED ORDER — AMOXICILLIN 500 MG PO CAPS: 500.0000 mg | ORAL_CAPSULE | Freq: Two times a day (BID) | ORAL | 0 refills | Status: AC

## 2021-10-15 ENCOUNTER — Telehealth: Payer: Self-pay

## 2021-10-15 DIAGNOSIS — K2100 Gastro-esophageal reflux disease with esophagitis, without bleeding: Secondary | ICD-10-CM

## 2021-10-15 NOTE — Telephone Encounter (Signed)
Pls contact pt to schedule appt. Last seen 11/2020. Thanks.

## 2021-10-15 NOTE — Telephone Encounter (Signed)
Called patient to schedule appt for further med refills. Transferred patient to your line as he didn't know exactly what medication he needed refills on & he didn't think he needed to take Crestor anymore. AMUCK

## 2021-10-16 ENCOUNTER — Other Ambulatory Visit: Payer: Self-pay

## 2021-10-16 DIAGNOSIS — K2100 Gastro-esophageal reflux disease with esophagitis, without bleeding: Secondary | ICD-10-CM

## 2021-10-16 MED ORDER — OMEPRAZOLE 40 MG PO CPDR: 40.0000 mg | DELAYED_RELEASE_CAPSULE | Freq: Two times a day (BID) | ORAL | 0 refills | Status: AC

## 2021-10-16 NOTE — Telephone Encounter (Signed)
Spoke to patient. Sent 30 day med refill for omeprazole. Scheduling appt.

## 2021-11-07 ENCOUNTER — Ambulatory Visit: Payer: Self-pay | Admitting: Family Medicine

## 2021-12-04 ENCOUNTER — Encounter: Payer: Self-pay | Admitting: Gastroenterology

## 2022-01-08 IMAGING — US US SOFT TISSUE HEAD/NECK
1 series · 8 of 8 positions shown · non-contrast
Comparison: None.

CLINICAL DATA: Right neck mass.

EXAM:
ULTRASOUND OF HEAD/NECK SOFT TISSUES
TECHNIQUE: Ultrasound examination of the head and neck soft tissues was
performed in the area of clinical concern.

[Series 1: us soft tissue head/neck · 8 acquisitions, 8 frames shown]
[im 1/8]
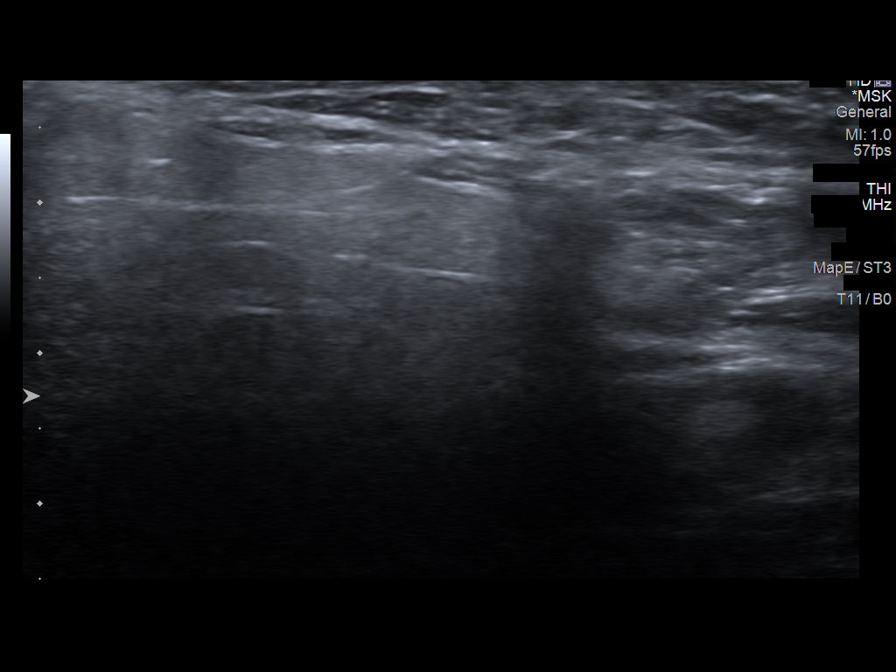
[im 2/8]
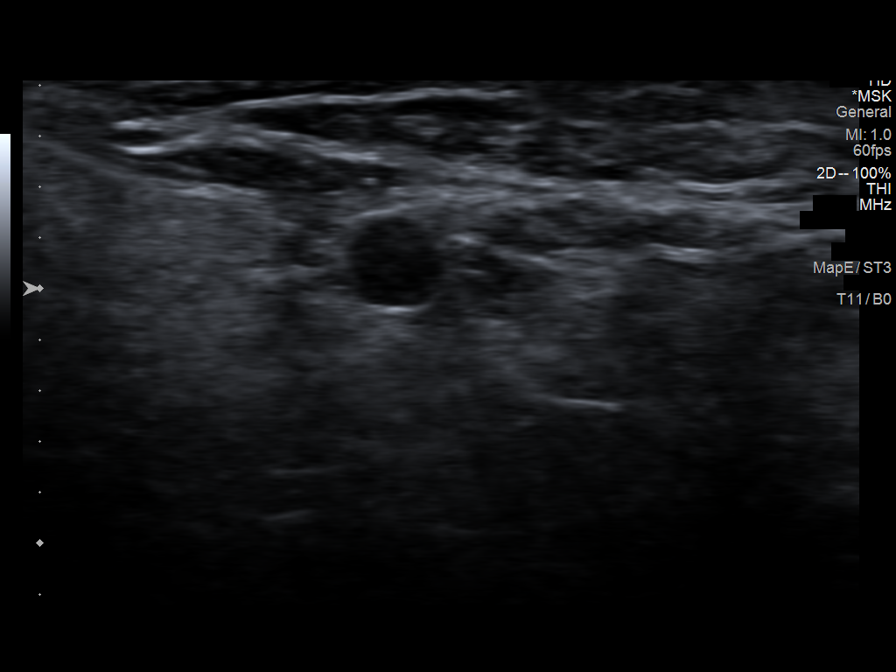
[im 3/8]
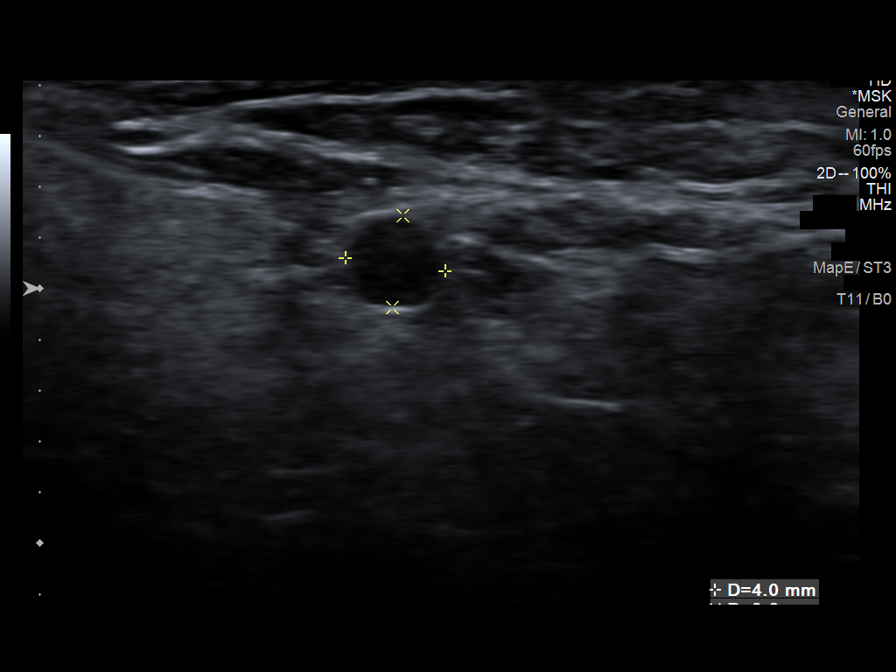
[im 4/8]
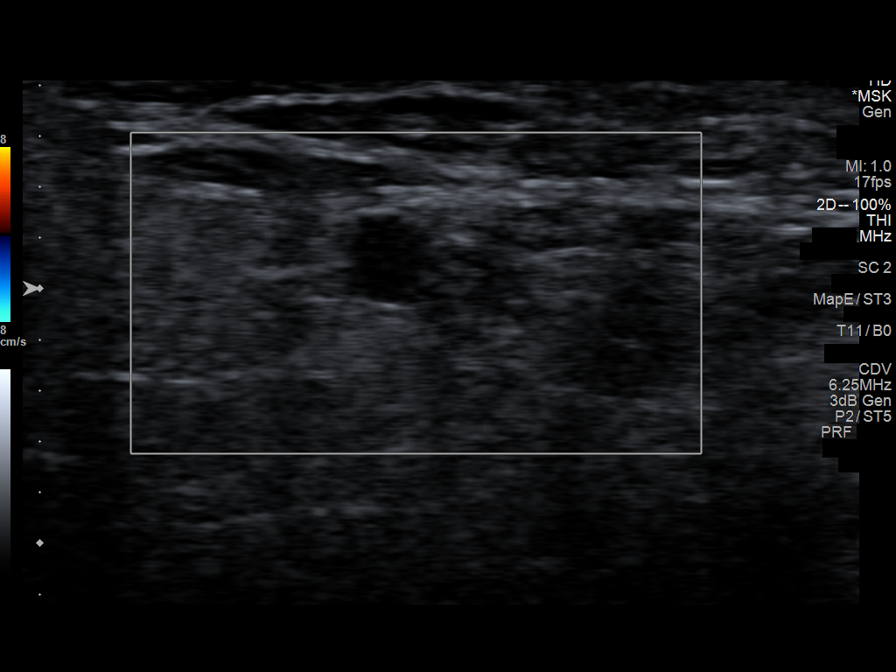
[im 5/8]
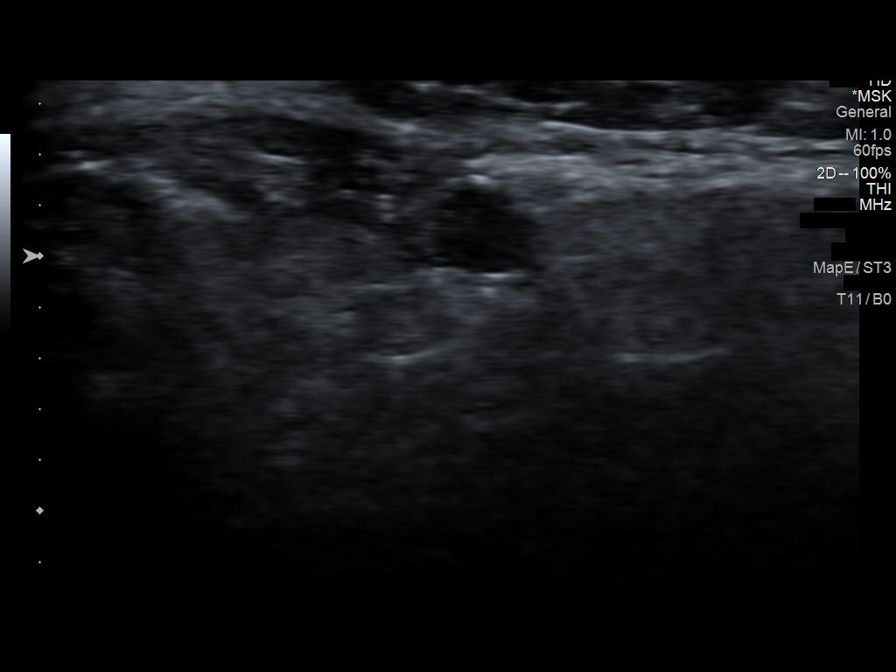
[im 6/8]
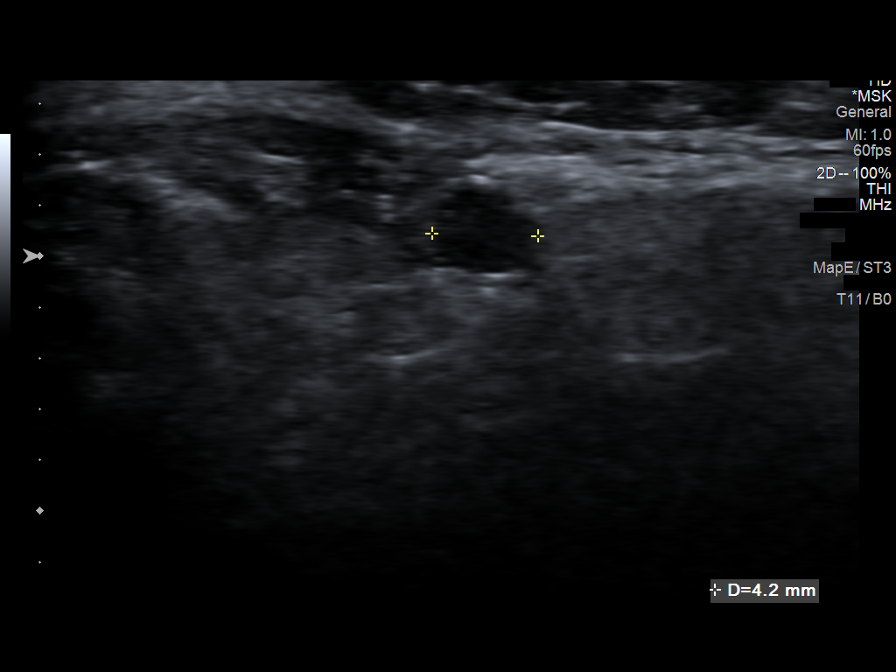
[im 7/8]
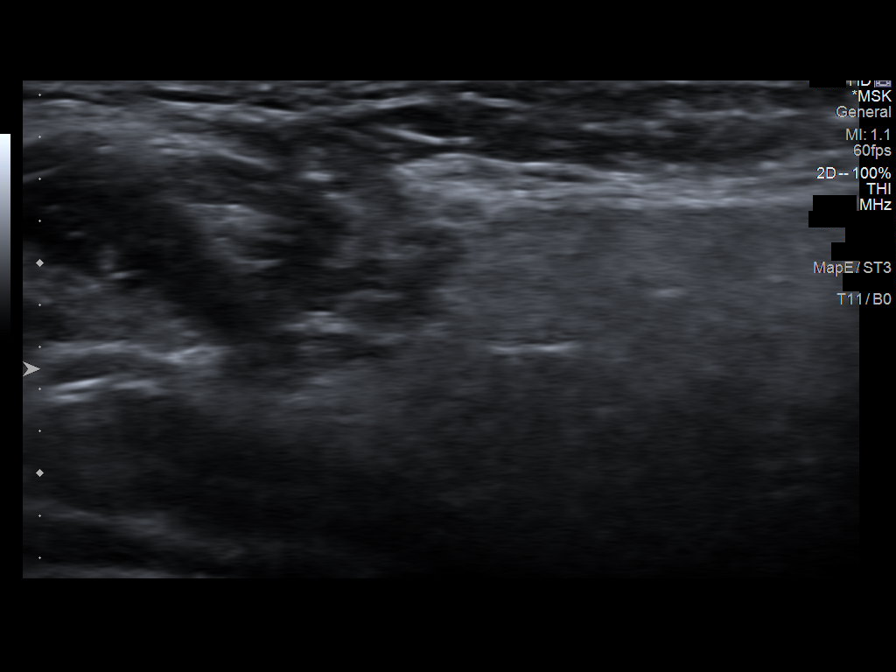
[im 8/8]
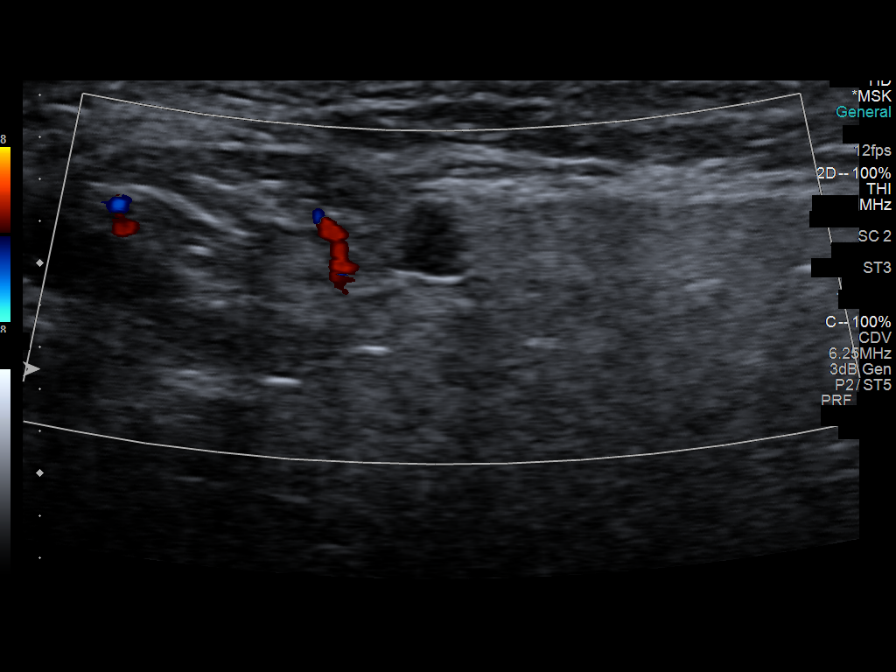

[8 of 8 positions shown; findings below may reference images not displayed]

FINDINGS: Targeted ultrasound in the area of clinical concern near the angle
of the mandible on the right demonstrates a 4 x 4 x 4 mm anechoic
focus with mild posterior acoustic enhancement and no internal
vascularity on color Doppler imaging. This may be immediately
adjacent to or possibly within the superficial, inferior most aspect
of the right parotid tail. No solid mass is identified in this
region.
IMPRESSION: Benign appearing 4 mm cyst in the area of clinical concern, possibly
associated with the parotid tail.

## 2022-03-07 ENCOUNTER — Telehealth: Payer: Self-pay | Admitting: Family Medicine

## 2022-03-07 DIAGNOSIS — J019 Acute sinusitis, unspecified: Secondary | ICD-10-CM

## 2022-03-07 DIAGNOSIS — B9689 Other specified bacterial agents as the cause of diseases classified elsewhere: Secondary | ICD-10-CM

## 2022-03-07 MED ORDER — AMOXICILLIN-POT CLAVULANATE 875-125 MG PO TABS: 1.0000 | ORAL_TABLET | Freq: Two times a day (BID) | ORAL | 0 refills | Status: AC

## 2022-03-07 NOTE — Progress Notes (Signed)

## 2022-06-09 ENCOUNTER — Telehealth: Payer: Self-pay | Admitting: General Practice

## 2022-06-09 NOTE — Transitions of Care (Post Inpatient/ED Visit) (Signed)
   06/09/2022  Name: [name] [name] MRN: [id number] DOB: [birth year]  Today's TOC FU Call Status: Today's TOC FU Call Status:: Successful TOC FU Call Competed TOC FU Call Complete Date: 06/09/22  Transition Care Management Follow-up Telephone Call Date of Discharge: 06/06/22 Discharge Facility: Other ([place]) Name of Other ([facility]) Discharge Facility: [facility] Type of Discharge: Emergency Department Reason for ED Visit: Cardiac Conditions Cardiac Conditions Diagnosis:  (hypertension) How have you been since you were released from the hospital?: Better Any questions or concerns?: No  Items Reviewed: Did you receive and understand the discharge instructions provided?: Yes Medications obtained and verified?: Yes (Medications Reviewed) Any new allergies since your discharge?: No Dietary orders reviewed?: NA Do you have support at home?: Yes  Home Care and Equipment/Supplies: [place] Ordered?: NA Any new equipment or medical supplies ordered?: NA  Functional Questionnaire: Do you need assistance with bathing/showering or dressing?: No Do you need assistance with meal preparation?: No Do you need assistance with eating?: No Do you have difficulty maintaining continence: No Do you need assistance with getting out of bed/getting out of a chair/moving?: No Do you have difficulty managing or taking your medications?: No  Folllow up appointments reviewed: PCP Follow-up appointment confirmed?: Yes Date of PCP follow-up appointment?: 06/12/22 Follow-up Provider: Dr. Zigmund Daniel Specialist [facility] Follow-up appointment confirmed?: NA Do you need transportation to your follow-up appointment?: No Do you understand care options if your condition(s) worsen?: Yes-patient verbalized understanding    SIGNATURE: Tinnie Gens, RN BSN

## 2022-06-12 ENCOUNTER — Ambulatory Visit: Payer: Self-pay | Admitting: Family Medicine

## 2022-06-26 ENCOUNTER — Ambulatory Visit: Payer: Self-pay | Admitting: Family Medicine

## 2022-07-15 ENCOUNTER — Encounter: Payer: Self-pay | Admitting: Family Medicine

## 2022-07-15 ENCOUNTER — Ambulatory Visit: Payer: Self-pay | Admitting: Family Medicine

## 2022-07-15 VITALS — BP 125/75 | HR 66 | Ht 69.0 in | Wt 199.0 lb

## 2022-07-15 DIAGNOSIS — E785 Hyperlipidemia, unspecified: Secondary | ICD-10-CM

## 2022-07-15 DIAGNOSIS — E782 Mixed hyperlipidemia: Secondary | ICD-10-CM

## 2022-07-15 DIAGNOSIS — I10 Essential (primary) hypertension: Secondary | ICD-10-CM

## 2022-07-15 NOTE — Progress Notes (Signed)
[name] [name] - 52 y.o. male MRN [id number]  Date of birth: 06-05-1970  Subjective Chief Complaint  Patient presents with   Hypertension    HPI [name] [name] is a 52 y.o. male here today for follow up of recent ER visit.   Seen in the ED a little over a month ago with complaint of chest pain and racing heart.  BP in the ED noted to be 242/128.  He had not taken blood pressure medication in quite some time.  Admitted to using energy pill from gas station as well. No EKG or significant lab changes.  Discharged with labetalol.  Today he reports that he is feeling well.  BP is well controlled today.  He is off of labetalol at this time, out of this x2 days.  He denies chest pain, shortness of breath, palpitations, headache or vision changes.   ROS:  A comprehensive ROS was completed and negative except as noted per HPI  No Known Allergies  Past Medical History:  Diagnosis Date   Allergy    Barrett's esophagus determined by biopsy    Chronic sinusitis    Gallbladder polyp 09/21/2018   4 mm   GERD (gastroesophageal reflux disease)    Helicobacter pylori gastritis    Hiatal hernia    Mixed hyperlipidemia 03/11/2018    Past Surgical History:  Procedure Laterality Date   SINUS IRRIGATION     UPPER GI ENDOSCOPY      Social History   Socioeconomic History   Marital status: Single    Spouse name: Not on file   Number of children: 4   Years of education: Not on file   Highest education level: Not on file  Occupational History   Occupation: painter  Tobacco Use   Smoking status: Never   Smokeless tobacco: Never  Vaping Use   Vaping Use: Never used  Substance and Sexual Activity   Alcohol use: Yes    Alcohol/week: 14.0 standard drinks of alcohol    Types: 14 Cans of beer per week   Drug use: No   Sexual activity: Yes    Birth control/protection: None  Other Topics Concern   Not on file  Social History Narrative   Not on file   Social Determinants of Health   Financial  Resource Strain: Low Risk  (07/15/2022)   Overall Financial Resource Strain (CARDIA)    Difficulty of Paying Living Expenses: Not very hard  Food Insecurity: No Food Insecurity (07/15/2022)   Hunger Vital Sign    Worried About Running Out of Food in the Last Year: Never true    Ran Out of Food in the Last Year: Never true  Transportation Needs: No Transportation Needs (07/15/2022)   PRAPARE - Administrator, Civil Service (Medical): No    Lack of Transportation (Non-Medical): No  Physical Activity: Unknown (07/15/2022)   Exercise Vital Sign    Days of Exercise per Week: 0 days    Minutes of Exercise per Session: Not on file  Stress: No Stress Concern Present (07/15/2022)   [employer] of Occupational Health - Occupational Stress Questionnaire    Feeling of Stress : Only a little  Social Connections: Socially Isolated (07/15/2022)   Social Connection and Isolation Panel [NHANES]    Frequency of Communication with Friends and Family: More than three times a week    Frequency of Social Gatherings with Friends and Family: More than three times a week    Attends Religious Services: Never  Active Member of Clubs or Organizations: No    Attends Engineer, structural: Not on file    Marital Status: Divorced    Family History  Problem Relation Age of Onset   Healthy Mother    Hypertension Father    Throat cancer Father    Esophageal cancer Father    Stomach cancer Brother    Colon cancer Paternal Aunt    Rectal cancer Neg Hx    Colon polyps Neg Hx     Health Maintenance  Topic Date Due   HIV Screening  Never done   Hepatitis C Screening  Never done   DTaP/Tdap/Td (1 - Tdap) Never done   COLONOSCOPY (Pts 45-10yrs Insurance coverage will need to be confirmed)  07/15/2023 (Originally 11/14/2021)   COVID-19 Vaccine (1) 07/31/2023 (Originally 07/17/1971)   INFLUENZA VACCINE  10/23/2022   HPV VACCINES  Aged Out      ----------------------------------------------------------------------------------------------------------------------------------------------------------------------------------------------------------------- Physical Exam BP 125/75 (BP Location: Left Arm, Patient Position: Sitting, Cuff Size: Normal)   Pulse 66   Ht  (1.753 m)   Wt 199 lb (90.3 kg)   SpO2 98%   BMI 29.39 kg/m   Physical Exam Constitutional:      Appearance: Normal appearance.  HENT:     Head: Normocephalic and atraumatic.  Eyes:     General: No scleral icterus. Cardiovascular:     Rate and Rhythm: Normal rate and regular rhythm.  Pulmonary:     Effort: Pulmonary effort is normal.     Breath sounds: Normal breath sounds.  Musculoskeletal:     Cervical back: Neck supple.  Neurological:     Mental Status: He is alert.  Psychiatric:        Mood and Affect: Mood normal.        Behavior: Behavior normal.     ------------------------------------------------------------------------------------------------------------------------------------------------------------------------------------------------------------------- Assessment and Plan  Hypertension BP elevated at ED visit previously.  Seems to have several contributors to this including stress, gas station energy pill and EtOH use may be contributing as well.  He will monitor BP over the next couple of weeks and let me know if this is becoming more elevated.   Hyperlipidemia Updated lipid panel ordered.     No orders of the defined types were placed in this encounter.   No follow-ups on file.    This visit occurred during the SARS-CoV-2 public health emergency.  Safety protocols were in place, including screening questions prior to the visit, additional usage of staff PPE, and extensive cleaning of exam room while observing appropriate contact time as indicated for disinfecting solutions.

## 2022-07-15 NOTE — Patient Instructions (Signed)
Managing Your Hypertension Hypertension, also called high blood pressure, is when the force of the blood pressing against the walls of the arteries is too strong. Arteries are blood vessels that carry blood from your heart throughout your body. Hypertension forces the heart to work harder to pump blood and may cause the arteries to become narrow or stiff. Understanding blood pressure readings A blood pressure reading includes a higher number over a lower number: The first, or top, number is called the systolic pressure. It is a measure of the pressure in your arteries as your heart beats. The second, or bottom number, is called the diastolic pressure. It is a measure of the pressure in your arteries as the heart relaxes. For most people, a normal blood pressure is below 120/80. Your personal target blood pressure may vary depending on your medical conditions, your age, and other factors. Blood pressure is classified into four stages. Based on your blood pressure reading, your health care provider may use the following stages to determine what type of treatment you need, if any. Systolic pressure and diastolic pressure are measured in a unit called millimeters of mercury (mmHg). Normal Systolic pressure: below 120. Diastolic pressure: below 80. Elevated Systolic pressure: 120-129. Diastolic pressure: below 80. Hypertension stage 1 Systolic pressure: 130-139. Diastolic pressure: 80-89. Hypertension stage 2 Systolic pressure: 140 or above. Diastolic pressure: 90 or above. How can this condition affect me? Managing your hypertension is very important. Over time, hypertension can damage the arteries and decrease blood flow to parts of the body, including the brain, heart, and kidneys. Having untreated or uncontrolled hypertension can lead to: A heart attack. A stroke. A weakened blood vessel (aneurysm). Heart failure. Kidney damage. Eye damage. Memory and concentration problems. Vascular  dementia. What actions can I take to manage this condition? Hypertension can be managed by making lifestyle changes and possibly by taking medicines. Your health care provider will help you make a plan to bring your blood pressure within a normal range. You may be referred for counseling on a healthy diet and physical activity. Nutrition  Eat a diet that is high in fiber and potassium, and low in salt (sodium), added sugar, and fat. An example eating plan is called the DASH diet. DASH stands for Dietary Approaches to Stop Hypertension. To eat this way: Eat plenty of fresh fruits and vegetables. Try to fill one-half of your plate at each meal with fruits and vegetables. Eat whole grains, such as whole-wheat pasta, brown rice, or whole-grain bread. Fill about one-fourth of your plate with whole grains. Eat low-fat dairy products. Avoid fatty cuts of meat, processed or cured meats, and poultry with skin. Fill about one-fourth of your plate with lean proteins such as fish, chicken without skin, beans, eggs, and tofu. Avoid pre-made and processed foods. These tend to be higher in sodium, added sugar, and fat. Reduce your daily sodium intake. Many people with hypertension should eat less than 1,500 mg of sodium a day. Lifestyle  Work with your health care provider to maintain a healthy body weight or to lose weight. Ask what an ideal weight is for you. Get at least 30 minutes of exercise that causes your heart to beat faster (aerobic exercise) most days of the week. Activities may include walking, swimming, or biking. Include exercise to strengthen your muscles (resistance exercise), such as weight lifting, as part of your weekly exercise routine. Try to do these types of exercises for 30 minutes at least 3 days a week. Do   not use any products that contain nicotine or tobacco. These products include cigarettes, chewing tobacco, and vaping devices, such as e-cigarettes. If you need help quitting, ask your  health care provider. Control any long-term (chronic) conditions you have, such as high cholesterol or diabetes. Identify your sources of stress and find ways to manage stress. This may include meditation, deep breathing, or making time for fun activities. Alcohol use Do not drink alcohol if: Your health care provider tells you not to drink. You are pregnant, may be pregnant, or are planning to become pregnant. If you drink alcohol: Limit how much you have to: 0-1 drink a day for women. 0-2 drinks a day for men. Know how much alcohol is in your drink. In the [place] one drink equals one 12 oz bottle of beer (355 mL), one 5 oz glass of wine (148 mL), or one 1 oz glass of hard liquor (44 mL). Medicines Your health care provider may prescribe medicine if lifestyle changes are not enough to get your blood pressure under control and if: Your systolic blood pressure is 130 or higher. Your diastolic blood pressure is 80 or higher. Take medicines only as told by your health care provider. Follow the directions carefully. Blood pressure medicines must be taken as told by your health care provider. The medicine does not work as well when you skip doses. Skipping doses also puts you at risk for problems. Monitoring Before you monitor your blood pressure: Do not smoke, drink caffeinated beverages, or exercise within 30 minutes before taking a measurement. Use the bathroom and empty your bladder (urinate). Sit quietly for at least 5 minutes before taking measurements. Monitor your blood pressure at home as told by your health care provider. To do this: Sit with your back straight and supported. Place your feet flat on the floor. Do not cross your legs. Support your arm on a flat surface, such as a table. Make sure your upper arm is at heart level. Each time you measure, take two or three readings one minute apart and record the results. You may also need to have your blood pressure checked regularly by  your health care provider. General information Talk with your health care provider about your diet, exercise habits, and other lifestyle factors that may be contributing to hypertension. Review all the medicines you take with your health care provider because there may be side effects or interactions. Keep all follow-up visits. Your health care provider can help you create and adjust your plan for managing your high blood pressure. Where to find more information National Heart, Lung, and Blood Institute: www.nhlbi.nih.gov American Heart Association: www.heart.org Contact a health care provider if: You think you are having a reaction to medicines you have taken. You have repeated (recurrent) headaches. You feel dizzy. You have swelling in your ankles. You have trouble with your vision. Get help right away if: You develop a severe headache or confusion. You have unusual weakness or numbness, or you feel faint. You have severe pain in your chest or abdomen. You vomit repeatedly. You have trouble breathing. These symptoms may be an emergency. Get help right away. Call 911. Do not wait to see if the symptoms will go away. Do not drive yourself to the hospital. Summary Hypertension is when the force of blood pumping through your arteries is too strong. If this condition is not controlled, it may put you at risk for serious complications. Your personal target blood pressure may vary depending on your medical conditions,   your age, and other factors. For most people, a normal blood pressure is less than 120/80. Hypertension is managed by lifestyle changes, medicines, or both. Lifestyle changes to help manage hypertension include losing weight, eating a healthy, low-sodium diet, exercising more, stopping smoking, and limiting alcohol. This information is not intended to replace advice given to you by your health care provider. Make sure you discuss any questions you have with your health care  provider. Document Revised: 11/22/2020 Document Reviewed: 11/22/2020 Elsevier Patient Education  2023 Elsevier Inc.  

## 2022-07-15 NOTE — Assessment & Plan Note (Signed)
Updated lipid panel ordered. 

## 2022-07-15 NOTE — Assessment & Plan Note (Addendum)
BP elevated at ED visit previously.  Seems to have several contributors to this including stress, gas station energy pill and EtOH use may be contributing as well.  He will monitor BP over the next couple of weeks and let me know if this is becoming more elevated.

## 2023-01-29 ENCOUNTER — Telehealth: Payer: Self-pay

## 2023-01-29 NOTE — Transitions of Care (Post Inpatient/ED Visit) (Signed)
01/29/2023  Name: [name] [name] MRN: [id number] DOB: 1970-12-06  Today's TOC FU Call Status: Today's TOC FU Call Status:: Successful TOC FU Call Completed TOC FU Call Complete Date: 01/29/23 Patient's Name and Date of Birth confirmed.  Transition Care Management Follow-up Telephone Call Date of Discharge: 01/28/23 Discharge Facility: Other [facility]) Name of Other ([facility]) Discharge Facility: [facility], [place] Type of Discharge: Inpatient Admission Primary Inpatient Discharge Diagnosis:: Pancreatitis Severe How have you been since you were released from the hospital?: Better Any questions or concerns?: No  Items Reviewed: Did you receive and understand the discharge instructions provided?: Yes Medications obtained,verified, and reconciled?: Yes (Medications Reviewed) Any new allergies since your discharge?: No Dietary orders reviewed?: NA Do you have support at home?: Yes People in Home: significant other Name of Support/Comfort Primary Source: [name] [name] (Spouse)  [phone] (Mobile)  Medications Reviewed Today: Reviewed outside medication list and added to medication reconciliation.  Medications Reviewed Today     Reviewed by [name] Quarry, [name] [name] (Case Manager) on 01/29/23 at 1641  Med List Status: <None>   Medication Order Taking? Sig Documenting Provider Last Dose Status Informant  acetaminophen (TYLENOL) 325 MG tablet [id number] Yes Take 650 mg by mouth every 6 (six) hours as needed (for Pain). [provider] Taking Active            Med Note Morey Hummingbird Jan 29, 2023  4:38 PM) From discharge medication list from [facility] 01/28/23.  fexofenadine (ALLEGRA) 60 MG tablet [id number] Yes Take 60 mg by mouth 2 (two) times daily. [provider] Taking Active            Med Note Morey Hummingbird Jan 29, 2023  4:38 PM) From discharge medication list from [facility] 01/28/23.  fluticasone (FLONASE) 50 MCG/ACT nasal  spray [id number] Yes SHAKE LIQUID AND USE 2 SPRAYS IN EACH NOSTRIL DAILY Everrett Coombe, DO Taking Active            Med Note Ysidro Evert Jan 29, 2023  4:33 PM) See outside Medication list on discharger 01/28/23 from [facility]   folic acid (FOLVITE) 1 MG tablet [id number] Yes Take 1 mg by mouth daily. [provider] Taking Active   lisinopril (ZESTRIL) 5 MG tablet [id number] Yes Take 5 mg by mouth daily. [provider] Taking Active            Med Note (   Thu Jan 29, 2023  4:40 PM) From discharge medication list from [facility] 01/28/23.  Multiple Vitamin (THERA) TABS [id number] Yes Take 1 tablet by mouth 1 day or 1 dose. [provider] Taking Active            Med Note Morey Hummingbird Jan 29, 2023  4:38 PM) From discharge medication list from [facility] 01/28/23.  omeprazole (PRILOSEC) 20 MG capsule [id number] Yes Take 20 mg by mouth 2 (two) times daily before a meal. [provider] Taking Active            Med Note (   Thu Jan 29, 2023  4:40 PM) From discharge medication list from [facility] 01/28/23.  omeprazole (PRILOSEC) 40 MG capsule [id number]  Take 1 capsule (40 mg total) by mouth in the morning and at bedtime. Everrett Coombe, DO  Expired 11/15/21 2359            Med Note (Uzziel Russey B  Thu Jan 29, 2023  4:30 PM) See outside medications list/ordered on discharge from [facility], [place] 01/28/23  ondansetron (ZOFRAN-ODT) 8 MG disintegrating tablet [id number] Yes Take 1 tablet (8 mg total) by mouth every 8 (eight) hours as needed for nausea. Christen Butter, NP Taking Active            Med Note Ysidro Evert Jan 29, 2023  4:33 PM) See outside Medication list on discharger 01/28/23 from [facility]   rosuvastatin (CRESTOR) 5 MG tablet [id number] Yes Take 1 tablet (5 mg total) by mouth daily. MUST KEEP APPT FOR REFILLS Everrett Coombe, DO Taking Active            Med  Note Ysidro Evert Jan 29, 2023  4:33 PM) See outside Medication list on discharger 01/28/23 from [facility]  thiamine (VITAMIN B1) 100 MG tablet [id number] Yes Take 100 mg by mouth daily. [provider] Taking Active            Med Note Morey Hummingbird Jan 29, 2023  4:38 PM) From discharge medication list from [facility] 01/28/23.            Home Care and Equipment/Supplies: Were Home Health Services Ordered?: No Any new equipment or medical supplies ordered?: No  Functional Questionnaire: Do you need assistance with bathing/showering or dressing?: No Do you need assistance with meal preparation?: No Do you need assistance with eating?: No Do you have difficulty maintaining continence: No Do you have difficulty managing or taking your medications?: No  Follow up appointments reviewed: PCP Follow-up appointment confirmed?: No (Was going to call when got off the phone.) MD Provider Line Number:[phone] Given: No Specialist Hospital Follow-up appointment confirmed?: Yes Date of Specialist follow-up appointment?: 02/18/23 Follow-Up Specialty Provider:: [facility] Do you need transportation to your follow-up appointment?: No Do you understand care options if your condition(s) worsen?: Yes-patient verbalized understanding  SDOH Interventions Today    Flowsheet Row Most Recent Value  SDOH Interventions   Food Insecurity Interventions Intervention Not Indicated  Housing Interventions Intervention Not Indicated  Transportation Interventions Intervention Not Indicated  Utilities Interventions Intervention Not Indicated      Patient said he was doing better and he never wanted to have that kind of pain again. Personal health goals are to "get healthy" and stop drinking. Will call PCP to set up post discharge follow up appointment. Felt he was doing okay and knew it would take some time to recover but did feel a lot better  (less pain). Medication list reviewed from [facility] and from [facility] and reconciled all discharge medications. High risk for readmission is 10%. Did not feel he needed a scheduled 30 day call follow up.   Gabriel Cirri MSN, [name] [name] [place]  [facility] Management Coordinator [email or web address] Direct Dial: [phone] Fax: [phone]
